# Patient Record
Sex: Female | Born: 1975 | Race: Black or African American | Hispanic: No | Marital: Single | Smoking: Never smoker
Health system: Southern US, Community
[De-identification: ages and names within clinical notes are randomized; demographics above are authoritative.]

## PROBLEM LIST (undated history)

## (undated) DIAGNOSIS — E0500 Thyrotoxicosis with diffuse goiter without thyrotoxic crisis or storm: Secondary | ICD-10-CM

## (undated) DIAGNOSIS — E890 Postprocedural hypothyroidism: Secondary | ICD-10-CM

## (undated) DIAGNOSIS — N921 Excessive and frequent menstruation with irregular cycle: Secondary | ICD-10-CM

## (undated) DIAGNOSIS — R5383 Other fatigue: Secondary | ICD-10-CM

## (undated) DIAGNOSIS — N914 Secondary oligomenorrhea: Secondary | ICD-10-CM

## (undated) DIAGNOSIS — E063 Autoimmune thyroiditis: Secondary | ICD-10-CM

## (undated) DIAGNOSIS — E039 Hypothyroidism, unspecified: Secondary | ICD-10-CM

## (undated) HISTORY — PX: ECTOPIC PREGNANCY SURGERY: SHX613

## (undated) HISTORY — DX: Postprocedural hypothyroidism: E89.0

## (undated) HISTORY — DX: Excessive and frequent menstruation with irregular cycle: N92.1

## (undated) HISTORY — DX: Other fatigue: R53.83

## (undated) HISTORY — DX: Thyrotoxicosis with diffuse goiter without thyrotoxic crisis or storm: E05.00

## (undated) HISTORY — DX: Hypothyroidism, unspecified: E03.9

## (undated) HISTORY — DX: Autoimmune thyroiditis: E06.3

## (undated) HISTORY — DX: Secondary oligomenorrhea: N91.4

---

## 1999-10-23 ENCOUNTER — Emergency Department (HOSPITAL_COMMUNITY): Admission: EM | Admit: 1999-10-23 | Discharge: 1999-10-23 | Payer: Self-pay

## 2000-04-23 ENCOUNTER — Emergency Department (HOSPITAL_COMMUNITY): Admission: EM | Admit: 2000-04-23 | Discharge: 2000-04-23 | Payer: Self-pay

## 2000-12-02 ENCOUNTER — Inpatient Hospital Stay (HOSPITAL_COMMUNITY): Admission: AD | Admit: 2000-12-02 | Discharge: 2000-12-02 | Payer: Self-pay | Admitting: Obstetrics

## 2000-12-02 ENCOUNTER — Encounter: Payer: Self-pay | Admitting: Obstetrics

## 2000-12-10 ENCOUNTER — Encounter: Payer: Self-pay | Admitting: Obstetrics

## 2000-12-10 ENCOUNTER — Inpatient Hospital Stay (HOSPITAL_COMMUNITY): Admission: RE | Admit: 2000-12-10 | Discharge: 2000-12-10 | Payer: Self-pay | Admitting: Obstetrics

## 2001-11-06 ENCOUNTER — Emergency Department (HOSPITAL_COMMUNITY): Admission: EM | Admit: 2001-11-06 | Discharge: 2001-11-06 | Payer: Self-pay | Admitting: Emergency Medicine

## 2005-08-28 ENCOUNTER — Ambulatory Visit: Payer: Self-pay | Admitting: Family Medicine

## 2005-09-04 ENCOUNTER — Ambulatory Visit (HOSPITAL_COMMUNITY): Admission: RE | Admit: 2005-09-04 | Discharge: 2005-09-04 | Payer: Self-pay | Admitting: Family Medicine

## 2005-09-19 ENCOUNTER — Ambulatory Visit: Payer: Self-pay | Admitting: *Deleted

## 2005-09-19 ENCOUNTER — Ambulatory Visit: Payer: Self-pay | Admitting: Family Medicine

## 2005-10-03 ENCOUNTER — Ambulatory Visit: Payer: Self-pay | Admitting: Family Medicine

## 2005-10-05 ENCOUNTER — Encounter (HOSPITAL_COMMUNITY): Admission: RE | Admit: 2005-10-05 | Discharge: 2006-01-03 | Payer: Self-pay | Admitting: Family Medicine

## 2005-11-07 ENCOUNTER — Ambulatory Visit: Payer: Self-pay | Admitting: "Endocrinology

## 2005-11-15 ENCOUNTER — Ambulatory Visit: Payer: Self-pay | Admitting: "Endocrinology

## 2006-01-08 ENCOUNTER — Ambulatory Visit: Payer: Self-pay | Admitting: "Endocrinology

## 2006-01-26 ENCOUNTER — Encounter (HOSPITAL_COMMUNITY): Admission: RE | Admit: 2006-01-26 | Discharge: 2006-04-26 | Payer: Self-pay | Admitting: "Endocrinology

## 2006-03-01 ENCOUNTER — Ambulatory Visit: Payer: Self-pay | Admitting: "Endocrinology

## 2006-06-19 ENCOUNTER — Ambulatory Visit: Payer: Self-pay | Admitting: "Endocrinology

## 2006-08-09 ENCOUNTER — Ambulatory Visit (HOSPITAL_COMMUNITY): Admission: RE | Admit: 2006-08-09 | Discharge: 2006-08-09 | Payer: Self-pay | Admitting: "Endocrinology

## 2006-09-11 ENCOUNTER — Ambulatory Visit (HOSPITAL_COMMUNITY): Admission: RE | Admit: 2006-09-11 | Discharge: 2006-09-11 | Payer: Self-pay | Admitting: "Endocrinology

## 2006-09-19 ENCOUNTER — Ambulatory Visit: Payer: Self-pay | Admitting: "Endocrinology

## 2006-10-12 ENCOUNTER — Encounter (HOSPITAL_COMMUNITY): Admission: RE | Admit: 2006-10-12 | Discharge: 2006-10-12 | Payer: Self-pay | Admitting: Internal Medicine

## 2006-12-24 ENCOUNTER — Ambulatory Visit: Payer: Self-pay | Admitting: "Endocrinology

## 2006-12-26 ENCOUNTER — Encounter: Admission: RE | Admit: 2006-12-26 | Discharge: 2006-12-26 | Payer: Self-pay | Admitting: "Endocrinology

## 2007-08-06 ENCOUNTER — Emergency Department (HOSPITAL_COMMUNITY): Admission: EM | Admit: 2007-08-06 | Discharge: 2007-08-06 | Payer: Self-pay | Admitting: *Deleted

## 2007-08-22 ENCOUNTER — Emergency Department (HOSPITAL_COMMUNITY): Admission: EM | Admit: 2007-08-22 | Discharge: 2007-08-22 | Payer: Self-pay | Admitting: Emergency Medicine

## 2007-12-13 ENCOUNTER — Ambulatory Visit: Payer: Self-pay | Admitting: "Endocrinology

## 2008-09-01 ENCOUNTER — Ambulatory Visit: Payer: Self-pay | Admitting: "Endocrinology

## 2009-03-03 ENCOUNTER — Ambulatory Visit: Payer: Self-pay | Admitting: "Endocrinology

## 2009-08-18 ENCOUNTER — Ambulatory Visit: Payer: Self-pay | Admitting: "Endocrinology

## 2010-01-04 ENCOUNTER — Emergency Department (HOSPITAL_COMMUNITY): Admission: EM | Admit: 2010-01-04 | Discharge: 2010-01-05 | Payer: Self-pay | Admitting: Emergency Medicine

## 2010-01-06 ENCOUNTER — Emergency Department (HOSPITAL_COMMUNITY): Admission: EM | Admit: 2010-01-06 | Discharge: 2010-01-06 | Payer: Self-pay | Admitting: Emergency Medicine

## 2010-10-05 ENCOUNTER — Ambulatory Visit: Payer: Self-pay | Admitting: "Endocrinology

## 2010-11-27 ENCOUNTER — Encounter: Payer: Self-pay | Admitting: "Endocrinology

## 2011-01-05 ENCOUNTER — Ambulatory Visit: Payer: Self-pay | Admitting: "Endocrinology

## 2011-01-30 LAB — CBC
HCT: 42.1 % (ref 36.0–46.0)
Hemoglobin: 13.8 g/dL (ref 12.0–15.0)
RBC: 4.39 MIL/uL (ref 3.87–5.11)
RDW: 13.5 % (ref 11.5–15.5)

## 2011-01-30 LAB — BASIC METABOLIC PANEL
CO2: 26 mEq/L (ref 19–32)
Calcium: 9.1 mg/dL (ref 8.4–10.5)
GFR calc non Af Amer: >60 mL/min (ref >60)
Potassium: 3.4 mEq/L — ABNORMAL LOW (ref 3.5–5.1)
Sodium: 133 mEq/L — ABNORMAL LOW (ref 135–145)

## 2011-01-30 LAB — DIFFERENTIAL
Basophils Relative: 0 % (ref 0–1)
Eosinophils Absolute: 0 10*3/uL (ref 0.0–0.7)
Eosinophils Relative: 0 % (ref 0–5)
Lymphs Abs: 1.8 10*3/uL (ref 0.7–4.0)
Monocytes Absolute: 0.9 10*3/uL (ref 0.1–1.0)
Neutro Abs: 26.8 10*3/uL — ABNORMAL HIGH (ref 1.7–7.7)
Neutrophils Relative %: 91 % — ABNORMAL HIGH (ref 43–77)

## 2011-01-30 LAB — RAPID STREP SCREEN (MED CTR MEBANE ONLY): Streptococcus, Group A Screen (Direct): NEGATIVE

## 2011-01-30 LAB — URINALYSIS, ROUTINE W REFLEX MICROSCOPIC
Glucose, UA: NEGATIVE mg/dL
Ketones, ur: >80 mg/dL — AB
Leukocytes, UA: NEGATIVE
Nitrite: NEGATIVE
Specific Gravity, Urine: 1.013 (ref 1.005–1.030)
pH: 6 (ref 5.0–8.0)

## 2011-01-30 LAB — URINE MICROSCOPIC-ADD ON

## 2011-02-07 ENCOUNTER — Ambulatory Visit: Payer: Self-pay | Admitting: "Endocrinology

## 2011-04-20 ENCOUNTER — Encounter: Payer: Self-pay | Admitting: *Deleted

## 2011-04-20 DIAGNOSIS — E0500 Thyrotoxicosis with diffuse goiter without thyrotoxic crisis or storm: Secondary | ICD-10-CM

## 2011-04-20 DIAGNOSIS — E890 Postprocedural hypothyroidism: Secondary | ICD-10-CM

## 2011-08-09 ENCOUNTER — Other Ambulatory Visit: Payer: Self-pay | Admitting: "Endocrinology

## 2011-08-14 ENCOUNTER — Other Ambulatory Visit: Payer: Self-pay | Admitting: "Endocrinology

## 2011-08-17 LAB — URINALYSIS, ROUTINE W REFLEX MICROSCOPIC
Ketones, ur: 15 — AB
Nitrite: POSITIVE — AB
Protein, ur: >300 — AB
Urobilinogen, UA: 1

## 2011-08-17 LAB — URINE CULTURE: Colony Count: >=100000

## 2011-08-17 LAB — POCT PREGNANCY, URINE: Operator id: 282201

## 2011-08-21 ENCOUNTER — Other Ambulatory Visit: Payer: Self-pay | Admitting: *Deleted

## 2011-08-23 ENCOUNTER — Telehealth: Payer: Self-pay | Admitting: *Deleted

## 2011-08-26 ENCOUNTER — Other Ambulatory Visit: Payer: Self-pay | Admitting: "Endocrinology

## 2011-09-06 NOTE — Telephone Encounter (Signed)
E-Scribed.

## 2011-12-06 ENCOUNTER — Other Ambulatory Visit: Payer: Self-pay | Admitting: *Deleted

## 2011-12-06 DIAGNOSIS — E0590 Thyrotoxicosis, unspecified without thyrotoxic crisis or storm: Secondary | ICD-10-CM

## 2011-12-09 LAB — T4, FREE: Free T4: 1.49 ng/dL (ref 0.80–1.80)

## 2011-12-09 LAB — T3, FREE: T3, Free: 2.7 pg/mL (ref 2.3–4.2)

## 2011-12-09 LAB — TSH: TSH: 2.456 u[IU]/mL (ref 0.350–4.500)

## 2011-12-18 ENCOUNTER — Ambulatory Visit: Payer: Self-pay | Admitting: "Endocrinology

## 2012-02-20 ENCOUNTER — Ambulatory Visit (INDEPENDENT_AMBULATORY_CARE_PROVIDER_SITE_OTHER): Payer: 59 | Admitting: "Endocrinology

## 2012-02-20 ENCOUNTER — Encounter: Payer: Self-pay | Admitting: "Endocrinology

## 2012-02-20 VITALS — BP 115/75 | HR 72 | Wt 116.3 lb

## 2012-02-20 DIAGNOSIS — R5383 Other fatigue: Secondary | ICD-10-CM

## 2012-02-20 DIAGNOSIS — N921 Excessive and frequent menstruation with irregular cycle: Secondary | ICD-10-CM

## 2012-02-20 DIAGNOSIS — E063 Autoimmune thyroiditis: Secondary | ICD-10-CM

## 2012-02-20 DIAGNOSIS — N920 Excessive and frequent menstruation with regular cycle: Secondary | ICD-10-CM

## 2012-02-20 DIAGNOSIS — E0500 Thyrotoxicosis with diffuse goiter without thyrotoxic crisis or storm: Secondary | ICD-10-CM

## 2012-02-20 DIAGNOSIS — E890 Postprocedural hypothyroidism: Secondary | ICD-10-CM

## 2012-02-20 DIAGNOSIS — N914 Secondary oligomenorrhea: Secondary | ICD-10-CM

## 2012-02-20 DIAGNOSIS — N915 Oligomenorrhea, unspecified: Secondary | ICD-10-CM

## 2012-02-20 NOTE — Patient Instructions (Signed)
Followup visit in 6 months. Please have lab tests repeated about 2 weeks prior to next visit.
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
## 2012-05-20 ENCOUNTER — Other Ambulatory Visit: Payer: Self-pay | Admitting: "Endocrinology

## 2012-06-24 ENCOUNTER — Other Ambulatory Visit: Payer: Self-pay | Admitting: "Endocrinology

## 2012-07-30 ENCOUNTER — Other Ambulatory Visit: Payer: Self-pay | Admitting: *Deleted

## 2012-07-30 DIAGNOSIS — E038 Other specified hypothyroidism: Secondary | ICD-10-CM

## 2012-08-01 ENCOUNTER — Ambulatory Visit: Payer: 59 | Admitting: "Endocrinology

## 2012-08-06 LAB — TSH: TSH: 3.072 u[IU]/mL (ref 0.350–4.500)

## 2012-08-06 LAB — T3, FREE: T3, Free: 3 pg/mL (ref 2.3–4.2)

## 2012-08-07 ENCOUNTER — Ambulatory Visit (INDEPENDENT_AMBULATORY_CARE_PROVIDER_SITE_OTHER): Payer: 59 | Admitting: "Endocrinology

## 2012-08-07 ENCOUNTER — Encounter: Payer: Self-pay | Admitting: "Endocrinology

## 2012-08-07 VITALS — BP 126/70 | HR 87 | Wt 110.4 lb

## 2012-08-07 DIAGNOSIS — E0500 Thyrotoxicosis with diffuse goiter without thyrotoxic crisis or storm: Secondary | ICD-10-CM

## 2012-08-07 DIAGNOSIS — F1510 Other stimulant abuse, uncomplicated: Secondary | ICD-10-CM

## 2012-08-07 DIAGNOSIS — IMO0002 Reserved for concepts with insufficient information to code with codable children: Secondary | ICD-10-CM

## 2012-08-07 DIAGNOSIS — E890 Postprocedural hypothyroidism: Secondary | ICD-10-CM

## 2012-08-07 DIAGNOSIS — E063 Autoimmune thyroiditis: Secondary | ICD-10-CM

## 2012-08-07 DIAGNOSIS — E049 Nontoxic goiter, unspecified: Secondary | ICD-10-CM

## 2012-08-07 NOTE — Patient Instructions (Signed)
Follow up visit in 6 months. Repeat TFTs in 3 and 6 months. Reduce caffeine intake by 25% per week for next 3-4 weeks.
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
## 2012-08-22 ENCOUNTER — Ambulatory Visit: Payer: 59 | Admitting: "Endocrinology

## 2012-10-19 ENCOUNTER — Other Ambulatory Visit: Payer: Self-pay | Admitting: "Endocrinology

## 2013-01-27 ENCOUNTER — Other Ambulatory Visit: Payer: Self-pay | Admitting: *Deleted

## 2013-01-27 DIAGNOSIS — E890 Postprocedural hypothyroidism: Secondary | ICD-10-CM

## 2013-02-17 LAB — TSH: TSH: 3.069 u[IU]/mL (ref 0.350–4.500)

## 2013-02-19 ENCOUNTER — Ambulatory Visit (INDEPENDENT_AMBULATORY_CARE_PROVIDER_SITE_OTHER): Payer: 59 | Admitting: "Endocrinology

## 2013-02-19 ENCOUNTER — Encounter: Payer: Self-pay | Admitting: "Endocrinology

## 2013-02-19 VITALS — BP 116/72 | HR 86 | Wt 114.2 lb

## 2013-02-19 DIAGNOSIS — R5383 Other fatigue: Secondary | ICD-10-CM

## 2013-02-19 DIAGNOSIS — E063 Autoimmune thyroiditis: Secondary | ICD-10-CM

## 2013-02-19 DIAGNOSIS — E039 Hypothyroidism, unspecified: Secondary | ICD-10-CM

## 2013-02-19 DIAGNOSIS — R5381 Other malaise: Secondary | ICD-10-CM

## 2013-02-19 DIAGNOSIS — E0500 Thyrotoxicosis with diffuse goiter without thyrotoxic crisis or storm: Secondary | ICD-10-CM

## 2013-02-19 DIAGNOSIS — E890 Postprocedural hypothyroidism: Secondary | ICD-10-CM

## 2013-02-19 MED ORDER — SYNTHROID 88 MCG PO TABS: ORAL_TABLET | ORAL | Status: DC
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
## 2013-02-19 NOTE — Patient Instructions (Signed)
Follow up visit in 6 months. Please repeat thyroid blood tests in 2 and 6 months.

## 2013-04-25 LAB — TSH: TSH: 0.225 u[IU]/mL — ABNORMAL LOW (ref 0.350–4.500)

## 2013-04-25 LAB — T3, FREE: T3, Free: 2.8 pg/mL (ref 2.3–4.2)

## 2013-05-08 ENCOUNTER — Telehealth: Payer: Self-pay | Admitting: "Endocrinology

## 2013-05-08 NOTE — Telephone Encounter (Signed)
1. I called patient with her lab results from 04/25/13: TSH was low at 0.225. Free T4 was higher at 1.30. Free T3 was higher at 2.8.  2. She is currently taking Synthroid, 88 mcg tablets. She takes one tablet 5 days per week and 1.5 tablets = 132 mcg on Wednesdays and Sundays. She feels "sort of hyper". 3. Assessment: patient is mildly hyperthyroid, both clinically and chemically. 4. Plan. Take only one 88 mcg tablet per day from now until July 14th. On July 14th take 1.5 tablets. Thereafter take 88 mcg/day 6 days per week, 1.5 tablets = 132 mcg each Sunday. Repeat TFTs in 2 months.  David Stall

## 2013-07-17 ENCOUNTER — Other Ambulatory Visit: Payer: Self-pay | Admitting: *Deleted

## 2013-07-17 DIAGNOSIS — E039 Hypothyroidism, unspecified: Secondary | ICD-10-CM

## 2013-07-23 ENCOUNTER — Encounter (HOSPITAL_COMMUNITY): Payer: Self-pay | Admitting: Emergency Medicine

## 2013-07-23 ENCOUNTER — Emergency Department (HOSPITAL_COMMUNITY)
Admission: EM | Admit: 2013-07-23 | Discharge: 2013-07-24 | Disposition: A | Discharge status: Home / Self Care | Payer: 59 | Attending: Emergency Medicine | Admitting: Emergency Medicine

## 2013-07-23 DIAGNOSIS — R05 Cough: Secondary | ICD-10-CM | POA: Insufficient documentation

## 2013-07-23 DIAGNOSIS — IMO0001 Reserved for inherently not codable concepts without codable children: Secondary | ICD-10-CM | POA: Insufficient documentation

## 2013-07-23 DIAGNOSIS — R059 Cough, unspecified: Secondary | ICD-10-CM | POA: Insufficient documentation

## 2013-07-23 DIAGNOSIS — J3489 Other specified disorders of nose and nasal sinuses: Secondary | ICD-10-CM | POA: Insufficient documentation

## 2013-07-23 DIAGNOSIS — R630 Anorexia: Secondary | ICD-10-CM | POA: Insufficient documentation

## 2013-07-23 DIAGNOSIS — R000 Tachycardia, unspecified: Secondary | ICD-10-CM | POA: Insufficient documentation

## 2013-07-23 DIAGNOSIS — J069 Acute upper respiratory infection, unspecified: Secondary | ICD-10-CM | POA: Insufficient documentation

## 2013-07-23 DIAGNOSIS — Z79899 Other long term (current) drug therapy: Secondary | ICD-10-CM | POA: Insufficient documentation

## 2013-07-23 DIAGNOSIS — E039 Hypothyroidism, unspecified: Secondary | ICD-10-CM | POA: Insufficient documentation

## 2013-07-23 NOTE — ED Notes (Signed)
Pt c/o recurrent fever throughout the day with cough, sneezing, chest discomfort with deep breath. Body aches, chills.

## 2013-07-24 MED ORDER — IBUPROFEN 200 MG PO TABS
400.0000 mg | ORAL_TABLET | Freq: Once | ORAL | Status: AC
  Administered 2013-07-24: 400 mg via ORAL
  Filled 2013-07-24: qty 2

## 2013-07-24 MED ORDER — HYDROCODONE-ACETAMINOPHEN 7.5-325 MG/15ML PO SOLN: 15.0000 mL | Freq: Four times a day (QID) | ORAL | Status: AC | PRN

## 2013-07-24 NOTE — ED Provider Notes (Signed)
Medical screening examination/treatment/procedure(s) were performed by non-physician practitioner and as supervising physician I was immediately available for consultation/collaboration.  Rutger Salton, MD 07/24/13 0415 

## 2013-07-24 NOTE — ED Notes (Signed)
Patient updated on treatment plan. Patient to take ibuprofen as ordered, drink po fluids and re-evaluate vital signs at @0050 

## 2013-07-24 NOTE — ED Provider Notes (Signed)
CSN: [id number]     Arrival date & time 07/23/13  2315 History   First MD Initiated Contact with Patient 07/23/13 2352     Chief Complaint  Patient presents with  . Fever   (Consider location/radiation/quality/duration/timing/severity/associated sxs/prior Treatment) HPI  37 year old female with hx of thyroid disease presents with URI sxs.  Pt sts yesterday she noticed that her throat was itchy.  Today while at work she endorse nasal congestion, sneeze, non productive cough, fever of 100.6, chills, body aches and decreased appetite.  No significant headache, neck stiffness, doe, hemoptysis, n/v/d, abd pain, back pain, dysuria or rash.  Has tried taking tylenol with some improvement.  Report exposed to niece last week that was sick. No recent out of state travel or travel to endemic area.  No significant risk factors for PE.    Past Medical History  Diagnosis Date  . Thyrotoxicosis with diffuse goiter   . Hypothyroid   . Hypothyroidism following radioiodine therapy   . Toxic diffuse goiter with exophthalmos   . Thyroiditis, autoimmune   . Fatigue   . Secondary oligomenorrhea   . Menometrorrhagia    Past Surgical History  Procedure Laterality Date  . Ectopic pregnancy surgery     Family History  Problem Relation Age of Onset  . Heart disease Father   . Thyroid disease Maternal Aunt     hypothyroid  . Heart disease Maternal Aunt   . Heart disease Paternal Aunt   . Thyroid disease Paternal Grandfather     hyperthyroid  . Heart disease Paternal Grandfather   . Diabetes Neg Hx    History  Substance Use Topics  . Smoking status: Never Smoker   . Smokeless tobacco: Never Used  . Alcohol Use: No   OB History   Grav Para Term Preterm Abortions TAB SAB Ect Mult Living                 Review of Systems  All other systems reviewed and are negative.    Allergies  Tapazole  Home Medications   Current Outpatient Rx  Name  Route  Sig  Dispense  Refill  . diphenhydrAMINE  (SOMINEX) 25 MG tablet   Oral   Take 25 mg by mouth at bedtime as needed for allergies.          Marland Kitchen levothyroxine (SYNTHROID, LEVOTHROID) 88 MCG tablet   Oral   Take 88-132 mcg by mouth daily before breakfast. Take 1 tablet all days of the week except Sunday take 1.5 tablets          BP 122/81  Pulse 118  Temp(Src) 99.3 F (37.4 C) (Oral)  Resp 20  Ht 5\' 1"  (1.549 m)  Wt 115 lb (52.164 kg)  BMI 21.74 kg/m2  SpO2 100%  LMP 06/29/2013 Physical Exam  Nursing note and vitals reviewed. Constitutional: She is oriented to person, place, and time. She appears well-developed and well-nourished. No distress.  Awake, alert, nontoxic appearance  HENT:  Head: Atraumatic.  Right Ear: External ear normal.  Left Ear: External ear normal.  Nose: Nose normal.  Mouth/Throat: Uvula is midline, oropharynx is clear and moist and mucous membranes are normal.  Eyes: Conjunctivae are normal. Right eye exhibits no discharge. Left eye exhibits no discharge.  Neck: Neck supple. No tracheal deviation present. No thyromegaly present.  No nuchal rigidity   Cardiovascular: Regular rhythm.   Tachycardic without M/R/G  Pulmonary/Chest: Effort normal. No respiratory distress. She exhibits no tenderness.  Abdominal: Soft.  There is no tenderness. There is no rebound.  Musculoskeletal: She exhibits no edema and no tenderness.  ROM appears intact, no obvious focal weakness  Lymphadenopathy:    She has no cervical adenopathy.  Neurological: She is alert and oriented to person, place, and time.  Mental status and motor strength appears intact  Skin: No rash noted.  Psychiatric: She has a normal mood and affect.    ED Course  Procedures (including critical care time)  Pt here with URI sxs.  No red flags.  No significant risk factor for PE although pt is tachycardic.  Has hx of thyroid disease currently taking synthroid.  Doubt thyrotoxicosis.    2:17 AM Vs improves after oral fluid and ibuprofen.  Pt  stable for d/c.  Return precaution discussed.    Labs Review Labs Reviewed - No data to display Imaging Review No results found.  MDM   1. URI (upper respiratory infection)    BP 107/57  Pulse 106  Temp(Src) 99.6 F (37.6 C) (Oral)  Resp 18  Ht 5\' 1"  (1.549 m)  Wt 115 lb (52.164 kg)  BMI 21.74 kg/m2  SpO2 100%  LMP 06/29/2013     Fayrene Helper, PA-C 07/24/13 0217

## 2013-08-06 LAB — T3, FREE: T3, Free: 2.5 pg/mL (ref 2.3–4.2)

## 2013-08-07 ENCOUNTER — Encounter: Payer: Self-pay | Admitting: "Endocrinology

## 2013-08-07 ENCOUNTER — Ambulatory Visit (INDEPENDENT_AMBULATORY_CARE_PROVIDER_SITE_OTHER): Payer: 59 | Admitting: "Endocrinology

## 2013-08-07 VITALS — BP 117/74 | HR 87 | Wt 116.3 lb

## 2013-08-07 DIAGNOSIS — R5381 Other malaise: Secondary | ICD-10-CM

## 2013-08-07 DIAGNOSIS — E063 Autoimmune thyroiditis: Secondary | ICD-10-CM

## 2013-08-07 DIAGNOSIS — E038 Other specified hypothyroidism: Secondary | ICD-10-CM

## 2013-08-07 DIAGNOSIS — H0520 Unspecified exophthalmos: Secondary | ICD-10-CM

## 2013-08-07 DIAGNOSIS — E0500 Thyrotoxicosis with diffuse goiter without thyrotoxic crisis or storm: Secondary | ICD-10-CM
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
## 2013-08-07 NOTE — Patient Instructions (Signed)
Follow up visit in 6 months. 

## 2013-08-21 ENCOUNTER — Ambulatory Visit: Payer: 59 | Admitting: "Endocrinology

## 2013-09-28 ENCOUNTER — Other Ambulatory Visit: Payer: Self-pay | Admitting: "Endocrinology

## 2013-09-28 DIAGNOSIS — E038 Other specified hypothyroidism: Secondary | ICD-10-CM

## 2013-10-06 ENCOUNTER — Ambulatory Visit: Payer: 59 | Attending: Internal Medicine | Admitting: Internal Medicine

## 2013-10-06 ENCOUNTER — Encounter: Payer: Self-pay | Admitting: Internal Medicine

## 2013-10-06 VITALS — BP 119/83 | HR 95 | Temp 98.5°F | Resp 16 | Ht 61.0 in | Wt 122.6 lb

## 2013-10-06 DIAGNOSIS — Z139 Encounter for screening, unspecified: Secondary | ICD-10-CM

## 2013-10-06 DIAGNOSIS — E890 Postprocedural hypothyroidism: Secondary | ICD-10-CM

## 2013-10-06 DIAGNOSIS — E039 Hypothyroidism, unspecified: Secondary | ICD-10-CM | POA: Insufficient documentation

## 2013-10-06 LAB — COMPLETE METABOLIC PANEL WITH GFR
AST: 13 U/L (ref 0–37)
Albumin: 4 g/dL (ref 3.5–5.2)
Alkaline Phosphatase: 70 U/L (ref 39–117)
BUN: 5 mg/dL — ABNORMAL LOW (ref 6–23)
Creat: 0.71 mg/dL (ref 0.50–1.10)
Potassium: 4 mEq/L (ref 3.5–5.3)
Sodium: 137 mEq/L (ref 135–145)
Total Protein: 6.7 g/dL (ref 6.0–8.3)

## 2013-10-06 LAB — CBC WITH DIFFERENTIAL/PLATELET
Basophils Absolute: 0 10*3/uL (ref 0.0–0.1)
Basophils Relative: 0 % (ref 0–1)
Eosinophils Absolute: 0.3 10*3/uL (ref 0.0–0.7)
Eosinophils Relative: 4 % (ref 0–5)
Hemoglobin: 14.3 g/dL (ref 12.0–15.0)
MCH: 32.1 pg (ref 26.0–34.0)
MCHC: 34.9 g/dL (ref 30.0–36.0)
MCV: 92.1 fL (ref 78.0–100.0)
Monocytes Absolute: 0.4 10*3/uL (ref 0.1–1.0)
Monocytes Relative: 5 % (ref 3–12)
Neutro Abs: 4.5 10*3/uL (ref 1.7–7.7)
Neutrophils Relative %: 57 % (ref 43–77)
Platelets: 313 10*3/uL (ref 150–400)
RDW: 13.6 % (ref 11.5–15.5)

## 2013-10-06 LAB — LIPID PANEL
HDL: 51 mg/dL (ref >39)
LDL Cholesterol: 77 mg/dL (ref 0–99)
Total CHOL/HDL Ratio: 2.8 Ratio
Triglycerides: 67 mg/dL (ref <150)
VLDL: 13 mg/dL (ref 0–40)

## 2013-10-06 NOTE — Progress Notes (Signed)
Patient here to establish care Takes synthroid for  Her thyroid History of graves disease

## 2013-10-06 NOTE — Progress Notes (Signed)
Patient ID: Jessica Maxwell, female   DOB: 1975/11/15, 37 y.o.   MRN: [id number] MRN: [id number] Name: Jessica Maxwell  Sex: female Age: 37 y.o. DOB: [birth year]  Allergies: Tapazole  Chief Complaint  Patient presents with  . Establish Care    HPI: Patient is 37 y.o. female who comes for the first time to establish medical care, she has history of graves disease status post radiation therapy, currently hypothyroid and taking levothyroxine 88 mcg following up with endocrinologist, she denies any acute symptoms, she brought the form to be filled out for her job, needs to be checked for hemoglobin A1c lipid profile and nicotine metabolites. Patient denies smoking cigarettes.  Past Medical History  Diagnosis Date  . Thyrotoxicosis with diffuse goiter   . Hypothyroid   . Hypothyroidism following radioiodine therapy   . Toxic diffuse goiter with exophthalmos   . Thyroiditis, autoimmune   . Fatigue   . Secondary oligomenorrhea   . Menometrorrhagia     Past Surgical History  Procedure Laterality Date  . Ectopic pregnancy surgery        Medication List       This list is accurate as of: 10/06/13 11:21 AM.  Always use your most recent med list.               diphenhydrAMINE 25 MG tablet  Commonly known as:  SOMINEX  Take 25 mg by mouth at bedtime as needed for allergies.     HYDROcodone-acetaminophen 7.5-325 mg/15 ml solution  Commonly known as:  HYCET  Take 15 mLs by mouth 4 (four) times daily as needed for pain.     levothyroxine 88 MCG tablet  Commonly known as:  SYNTHROID, LEVOTHROID  Take 88-132 mcg by mouth daily before breakfast. Take 1 tablet all days of the week except Sunday take 1.5 tablets        No orders of the defined types were placed in this encounter.     There is no immunization history on file for this patient.  History  Substance Use Topics  . Smoking status: Never Smoker   . Smokeless tobacco: Never Used  . Alcohol Use: No    Review of  Systems  As noted in HPI  Filed Vitals:   10/06/13 1053  BP: 119/83  Pulse: 95  Temp: 98.5 F (36.9 C)  Resp: 16    Physical Exam  Physical Exam  Constitutional: No distress.  Eyes: EOM are normal. Pupils are equal, round, and reactive to light.  Neck:  Mild thyromegaly   Cardiovascular: Normal rate and regular rhythm.   Pulmonary/Chest: Breath sounds normal. No respiratory distress. She has no wheezes. She has no rales.  Musculoskeletal: She exhibits no edema.    CBC    Component Value Date/Time   WBC 29.5* 01/04/2010 2215   RBC 4.39 01/04/2010 2215   HGB 13.8 01/04/2010 2215   HCT 42.1 01/04/2010 2215   PLT 293 01/04/2010 2215   MCV 96.0 01/04/2010 2215   LYMPHSABS 1.8 01/04/2010 2215   MONOABS 0.9 01/04/2010 2215   EOSABS 0.0 01/04/2010 2215   BASOSABS 0.0 01/04/2010 2215    CMP     Component Value Date/Time   NA 133* 01/04/2010 2215   K 3.4* 01/04/2010 2215   CL 100 01/04/2010 2215   CO2 26 01/04/2010 2215   GLUCOSE 86 01/04/2010 2215   BUN 5* 01/04/2010 2215   CREATININE 0.68 01/04/2010 2215   CALCIUM 9.1 01/04/2010 2215   GFRNONAA >60  01/04/2010 2215   GFRAA  Value: >60        The eGFR has been calculated using the MDRD equation. This calculation has not been validated in all clinical situations. eGFR's persistently <60 mL/min signify possible Chronic Kidney Disease. 01/04/2010 2215    No results found for this basename: chol, tri, ldl    No components found with this basename: hga1c    No results found for this basename: AST    Assessment and Plan  Hypothyroidism following radioiodine therapy.. continue with current dose of levothyroxine as per her endocrinologist.  Screening - Plan: Hemoglobin A1c, Nicotine/cotinine metabolites, Lipid panel, Vit D  25 hydroxy (rtn osteoporosis monitoring), CBC with Differential, COMPLETE METABOLIC PANEL WITH GFR, Ambulatory referral to Gynecology   Health Maintenance - Patient declined flu shot   -Pap Smear: Referred to GYN   Return in  about 3 weeks (around 10/27/2013).  Doris Cheadle, MD

## 2013-10-07 LAB — VITAMIN D 25 HYDROXY (VIT D DEFICIENCY, FRACTURES): Vit D, 25-Hydroxy: 10 ng/mL — ABNORMAL LOW (ref 30–89)

## 2013-10-08 LAB — NICOTINE/COTININE METABOLITES: Cotinine: <10 ng/mL

## 2013-10-27 ENCOUNTER — Ambulatory Visit: Payer: 59 | Admitting: Internal Medicine

## 2013-10-27 ENCOUNTER — Encounter: Payer: Self-pay | Admitting: *Deleted

## 2014-01-28 ENCOUNTER — Encounter: Payer: 59 | Admitting: Family Medicine

## 2014-02-05 ENCOUNTER — Encounter: Payer: Self-pay | Admitting: "Endocrinology

## 2014-02-05 ENCOUNTER — Ambulatory Visit: Payer: 59 | Admitting: "Endocrinology

## 2014-02-05 ENCOUNTER — Ambulatory Visit (INDEPENDENT_AMBULATORY_CARE_PROVIDER_SITE_OTHER): Payer: 59 | Admitting: "Endocrinology

## 2014-02-05 VITALS — BP 107/78 | HR 85 | Wt 123.0 lb

## 2014-02-05 DIAGNOSIS — E063 Autoimmune thyroiditis: Secondary | ICD-10-CM

## 2014-02-05 DIAGNOSIS — R5383 Other fatigue: Secondary | ICD-10-CM

## 2014-02-05 DIAGNOSIS — E890 Postprocedural hypothyroidism: Secondary | ICD-10-CM

## 2014-02-05 DIAGNOSIS — R5381 Other malaise: Secondary | ICD-10-CM

## 2014-02-05 DIAGNOSIS — H0520 Unspecified exophthalmos: Secondary | ICD-10-CM

## 2014-02-05 DIAGNOSIS — E049 Nontoxic goiter, unspecified: Secondary | ICD-10-CM
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
## 2014-02-05 NOTE — Patient Instructions (Signed)
Follow up visit in 6 months. Please have lab tests done in 3 months and 6 months.

## 2014-02-20 ENCOUNTER — Encounter: Payer: Self-pay | Admitting: Medical

## 2014-02-20 ENCOUNTER — Ambulatory Visit (INDEPENDENT_AMBULATORY_CARE_PROVIDER_SITE_OTHER): Payer: 59 | Admitting: Medical

## 2014-02-20 VITALS — BP 113/81 | HR 79 | Ht 61.0 in | Wt 128.2 lb

## 2014-02-20 DIAGNOSIS — Z01419 Encounter for gynecological examination (general) (routine) without abnormal findings: Secondary | ICD-10-CM

## 2014-02-20 NOTE — Progress Notes (Signed)
Patient ID: Jessica Maxwell, female   DOB: [birth year], 38 y.o.   MRN: [id number] Subjective:    Jessica Maxwell is a 38 y.o. female who presents for an annual exam. The patient has no complaints today. The patient is sexually active. GYN screening history: last pap: was normal. Last Pap smear ~ 10 years ago. The patient wears seatbelts: yes. The patient participates in regular exercise: yes. Has the patient ever been transfused or tattooed?: yes. Tattoo on right calf. The patient reports that there is not domestic violence in her life. The patient denies a history of abnormal pap smears. She states she has regular periods. She denies abdominal pain, vaginal discharge, irregular bleeding or breast concerns. She is not on anything for birth control, but declines birth control counseling today.   Menstrual History: OB History   Grav Para Term Preterm Abortions TAB SAB Ect Mult Living   3    3 2  1   0      Menarche age: 38 years old  Patient's last menstrual period was 01/29/2014.    The following portions of the patient's history were reviewed and updated as appropriate: allergies, current medications, past family history, past medical history, past social history, past surgical history and problem list.  Review of Systems Pertinent items are noted in HPI.    Objective:     BP 113/81  Pulse 79  Ht 5\' 1"  (1.549 m)  Wt 128 lb 3.2 oz (58.151 kg)  BMI 24.24 kg/m2  LMP 01/29/2014 GENERAL: Well-developed, well-nourished female in no acute distress.  HEENT: Normocephalic, atraumatic. Sclerae anicteric.  LUNGS: Clear to auscultation bilaterally.  HEART: Regular rate and rhythm. BREASTS: Symmetric in size. No masses, skin changes, nipple drainage, or lymphadenopathy. ABDOMEN: Soft, nontender, nondistended. No organomegaly. PELVIC: Normal external female genitalia. Vagina is pink and rugated.  Normal discharge. Normal cervix contour. Pap smear obtained. Uterus is normal in size. No adnexal mass  or tenderness.  EXTREMITIES: No cyanosis, clubbing, or edema  .    Assessment:    Healthy female exam.    Plan:     1. Pap smear obtained. Patient will be contacted with any abnormal results  2. Patient to return to [facility] in 1 year for annual exam or sooner PRN

## 2014-05-20 LAB — T4, FREE: Free T4: 1.34 ng/dL (ref 0.80–1.80)

## 2014-05-20 LAB — T3, FREE: T3, Free: 3.1 pg/mL (ref 2.3–4.2)

## 2014-05-20 LAB — TSH: TSH: 0.736 u[IU]/mL (ref 0.350–4.500)

## 2014-05-22 ENCOUNTER — Telehealth: Payer: Self-pay | Admitting: *Deleted

## 2014-05-22 NOTE — Telephone Encounter (Signed)
Spoke to patient, advised that  TFTs in July were normal, in the upper 20% of the normal range.

## 2014-05-30 ENCOUNTER — Other Ambulatory Visit: Payer: Self-pay | Admitting: "Endocrinology

## 2014-06-09 ENCOUNTER — Other Ambulatory Visit: Payer: Self-pay | Admitting: "Endocrinology

## 2014-08-10 ENCOUNTER — Ambulatory Visit: Payer: 59 | Admitting: "Endocrinology

## 2014-08-19 ENCOUNTER — Ambulatory Visit (INDEPENDENT_AMBULATORY_CARE_PROVIDER_SITE_OTHER): Payer: 59 | Admitting: "Endocrinology

## 2014-08-19 ENCOUNTER — Encounter: Payer: Self-pay | Admitting: "Endocrinology

## 2014-08-19 VITALS — BP 128/77 | HR 88 | Wt 127.5 lb

## 2014-08-19 DIAGNOSIS — E049 Nontoxic goiter, unspecified: Secondary | ICD-10-CM

## 2014-08-19 DIAGNOSIS — E063 Autoimmune thyroiditis: Secondary | ICD-10-CM

## 2014-08-19 DIAGNOSIS — R5382 Chronic fatigue, unspecified: Secondary | ICD-10-CM

## 2014-08-19 DIAGNOSIS — H0520 Unspecified exophthalmos: Secondary | ICD-10-CM

## 2014-08-19 DIAGNOSIS — E349 Endocrine disorder, unspecified: Secondary | ICD-10-CM

## 2014-08-19 DIAGNOSIS — E890 Postprocedural hypothyroidism: Secondary | ICD-10-CM

## 2014-08-19 DIAGNOSIS — R5381 Other malaise: Secondary | ICD-10-CM
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
## 2014-08-19 NOTE — Patient Instructions (Signed)
Follow up visit in 6 months. Please have lab tests drawn at 3 months and one week before next visit.

## 2014-08-20 DIAGNOSIS — H0520 Unspecified exophthalmos: Secondary | ICD-10-CM | POA: Insufficient documentation

## 2014-08-20 DIAGNOSIS — E349 Endocrine disorder, unspecified: Secondary | ICD-10-CM | POA: Insufficient documentation

## 2014-08-20 LAB — T3, FREE: T3, Free: 2.6 pg/mL (ref 2.3–4.2)

## 2014-08-20 LAB — TSH: TSH: 1.027 u[IU]/mL (ref 0.350–4.500)

## 2014-08-20 LAB — T4, FREE: Free T4: 1.07 ng/dL (ref 0.80–1.80)

## 2014-08-21 ENCOUNTER — Other Ambulatory Visit: Payer: Self-pay

## 2014-08-25 LAB — THYROID STIMULATING IMMUNOGLOBULIN: TSI: 50 %{baseline} (ref <140)

## 2014-09-07 ENCOUNTER — Encounter: Payer: Self-pay | Admitting: "Endocrinology

## 2014-10-12 ENCOUNTER — Encounter: Payer: Self-pay | Admitting: *Deleted

## 2015-01-13 ENCOUNTER — Other Ambulatory Visit: Payer: Self-pay | Admitting: *Deleted

## 2015-01-13 DIAGNOSIS — E034 Atrophy of thyroid (acquired): Secondary | ICD-10-CM

## 2015-02-03 ENCOUNTER — Other Ambulatory Visit: Payer: Self-pay | Admitting: "Endocrinology

## 2015-02-04 ENCOUNTER — Telehealth: Payer: Self-pay | Admitting: *Deleted

## 2015-02-04 NOTE — Telephone Encounter (Signed)
LVM to call and reschedule 4/14 appt.

## 2015-02-08 ENCOUNTER — Other Ambulatory Visit: Payer: Self-pay | Admitting: *Deleted

## 2015-02-08 ENCOUNTER — Telehealth: Payer: Self-pay | Admitting: "Endocrinology

## 2015-02-08 DIAGNOSIS — E890 Postprocedural hypothyroidism: Secondary | ICD-10-CM

## 2015-02-08 MED ORDER — LEVOTHYROXINE SODIUM 88 MCG PO TABS: 88.0000 ug | ORAL_TABLET | Freq: Every day | ORAL | Status: DC

## 2015-02-08 NOTE — Telephone Encounter (Signed)
Sent via escribe

## 2015-02-12 ENCOUNTER — Other Ambulatory Visit: Payer: Self-pay | Admitting: "Endocrinology

## 2015-02-18 ENCOUNTER — Ambulatory Visit: Payer: 59 | Admitting: "Endocrinology

## 2015-03-31 ENCOUNTER — Ambulatory Visit (INDEPENDENT_AMBULATORY_CARE_PROVIDER_SITE_OTHER): Payer: BLUE CROSS/BLUE SHIELD | Admitting: "Endocrinology

## 2015-03-31 ENCOUNTER — Encounter: Payer: Self-pay | Admitting: "Endocrinology

## 2015-03-31 VITALS — BP 113/81 | HR 76 | Wt 130.2 lb

## 2015-03-31 DIAGNOSIS — R5383 Other fatigue: Secondary | ICD-10-CM

## 2015-03-31 DIAGNOSIS — E0500 Thyrotoxicosis with diffuse goiter without thyrotoxic crisis or storm: Secondary | ICD-10-CM | POA: Diagnosis not present

## 2015-03-31 DIAGNOSIS — E063 Autoimmune thyroiditis: Secondary | ICD-10-CM | POA: Diagnosis not present

## 2015-03-31 DIAGNOSIS — E890 Postprocedural hypothyroidism: Secondary | ICD-10-CM | POA: Diagnosis not present

## 2015-03-31 DIAGNOSIS — H0520 Unspecified exophthalmos: Secondary | ICD-10-CM

## 2015-03-31 DIAGNOSIS — E349 Endocrine disorder, unspecified: Secondary | ICD-10-CM

## 2015-03-31 LAB — T3, FREE: T3, Free: 3.1 pg/mL (ref 2.3–4.2)

## 2015-03-31 LAB — T4, FREE: Free T4: 1.29 ng/dL (ref 0.80–1.80)

## 2015-03-31 LAB — TSH: TSH: 0.382 u[IU]/mL (ref 0.350–4.500)
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
## 2015-03-31 NOTE — Patient Instructions (Signed)
Follow up visit in 6 months. Please repeat thyroid blood tests in August. Please repeat thyroid blood tests and TSI in November.

## 2015-10-04 ENCOUNTER — Ambulatory Visit: Payer: Self-pay | Admitting: "Endocrinology

## 2015-10-04 ENCOUNTER — Other Ambulatory Visit: Payer: Self-pay | Admitting: "Endocrinology

## 2015-10-04 LAB — T4, FREE: Free T4: 1.15 ng/dL (ref 0.80–1.80)

## 2015-10-04 LAB — TSH: TSH: 4.29 u[IU]/mL (ref 0.350–4.500)

## 2015-10-04 LAB — T3, FREE: T3, Free: 2.4 pg/mL (ref 2.3–4.2)

## 2015-10-07 ENCOUNTER — Ambulatory Visit (INDEPENDENT_AMBULATORY_CARE_PROVIDER_SITE_OTHER): Payer: BLUE CROSS/BLUE SHIELD | Admitting: "Endocrinology

## 2015-10-07 ENCOUNTER — Encounter: Payer: Self-pay | Admitting: "Endocrinology

## 2015-10-07 VITALS — BP 111/78 | HR 80 | Wt 137.0 lb

## 2015-10-07 DIAGNOSIS — E0500 Thyrotoxicosis with diffuse goiter without thyrotoxic crisis or storm: Secondary | ICD-10-CM

## 2015-10-07 DIAGNOSIS — E049 Nontoxic goiter, unspecified: Secondary | ICD-10-CM | POA: Diagnosis not present

## 2015-10-07 DIAGNOSIS — E663 Overweight: Secondary | ICD-10-CM | POA: Diagnosis not present

## 2015-10-07 DIAGNOSIS — E349 Endocrine disorder, unspecified: Secondary | ICD-10-CM

## 2015-10-07 DIAGNOSIS — E063 Autoimmune thyroiditis: Secondary | ICD-10-CM

## 2015-10-07 DIAGNOSIS — R5383 Other fatigue: Secondary | ICD-10-CM

## 2015-10-07 DIAGNOSIS — E890 Postprocedural hypothyroidism: Secondary | ICD-10-CM | POA: Diagnosis not present

## 2015-10-07 DIAGNOSIS — H0520 Unspecified exophthalmos: Secondary | ICD-10-CM

## 2015-10-07 LAB — THYROID STIMULATING IMMUNOGLOBULIN: TSI: 62 % baseline (ref <140)

## 2015-10-07 MED ORDER — SYNTHROID 88 MCG PO TABS: ORAL_TABLET | ORAL | Status: DC
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
## 2015-10-07 NOTE — Patient Instructions (Signed)
Follow up visit in 6 months. Please repeat thyroid blood tests one week prior to next visit.

## 2015-10-29 ENCOUNTER — Other Ambulatory Visit: Payer: Self-pay | Admitting: *Deleted

## 2015-10-29 DIAGNOSIS — E890 Postprocedural hypothyroidism: Secondary | ICD-10-CM

## 2015-10-29 MED ORDER — SYNTHROID 88 MCG PO TABS: ORAL_TABLET | ORAL | Status: DC

## 2015-12-13 ENCOUNTER — Telehealth: Payer: Self-pay | Admitting: "Endocrinology

## 2015-12-13 NOTE — Telephone Encounter (Signed)
1. I called the patient with her lab results from her last visit. Unfortunately, she was not available. 2. I left a voicemail message. Your TSH was abnormal, but your free T4, free T3, and TSI were normal. I would like you to repeat the labs within the next week. If you have any questions, please call me at the office. Sherrlyn Hock

## 2015-12-17 LAB — T3, FREE: T3, Free: 2.7 pg/mL (ref 2.3–4.2)

## 2015-12-17 LAB — TSH: TSH: 2.35 m[IU]/L

## 2015-12-17 LAB — T4, FREE: FREE T4: 1.4 ng/dL (ref 0.8–1.8)

## 2015-12-22 LAB — THYROID STIMULATING IMMUNOGLOBULIN: TSI: 94 %{baseline} (ref <140)

## 2015-12-28 ENCOUNTER — Encounter: Payer: Self-pay | Admitting: *Deleted

## 2016-03-09 ENCOUNTER — Other Ambulatory Visit: Payer: Self-pay | Admitting: *Deleted

## 2016-03-09 DIAGNOSIS — E034 Atrophy of thyroid (acquired): Secondary | ICD-10-CM

## 2016-04-06 ENCOUNTER — Ambulatory Visit: Payer: BLUE CROSS/BLUE SHIELD | Admitting: "Endocrinology

## 2016-04-13 DIAGNOSIS — E034 Atrophy of thyroid (acquired): Secondary | ICD-10-CM | POA: Diagnosis not present

## 2016-04-13 DIAGNOSIS — E038 Other specified hypothyroidism: Secondary | ICD-10-CM | POA: Diagnosis not present

## 2016-04-14 LAB — T4, FREE: Free T4: 1.3 ng/dL (ref 0.8–1.8)

## 2016-04-14 LAB — TSH: TSH: 3.54 m[IU]/L

## 2016-04-14 LAB — T3, FREE: T3 FREE: 2.4 pg/mL (ref 2.3–4.2)

## 2016-04-20 ENCOUNTER — Ambulatory Visit (INDEPENDENT_AMBULATORY_CARE_PROVIDER_SITE_OTHER): Payer: BLUE CROSS/BLUE SHIELD | Admitting: "Endocrinology

## 2016-04-20 ENCOUNTER — Encounter: Payer: Self-pay | Admitting: "Endocrinology

## 2016-04-20 VITALS — BP 115/78 | HR 101 | Wt 148.8 lb

## 2016-04-20 DIAGNOSIS — E663 Overweight: Secondary | ICD-10-CM

## 2016-04-20 DIAGNOSIS — E063 Autoimmune thyroiditis: Secondary | ICD-10-CM

## 2016-04-20 DIAGNOSIS — E890 Postprocedural hypothyroidism: Secondary | ICD-10-CM

## 2016-04-20 DIAGNOSIS — E349 Endocrine disorder, unspecified: Secondary | ICD-10-CM

## 2016-04-20 DIAGNOSIS — E0500 Thyrotoxicosis with diffuse goiter without thyrotoxic crisis or storm: Secondary | ICD-10-CM | POA: Diagnosis not present

## 2016-04-20 MED ORDER — SYNTHROID 100 MCG PO TABS: ORAL_TABLET | ORAL | Status: DC

## 2016-04-20 MED ORDER — LEVOTHYROXINE SODIUM 100 MCG PO TABS: 100.0000 ug | ORAL_TABLET | Freq: Every day | ORAL | Status: DC
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
## 2016-04-20 NOTE — Patient Instructions (Signed)
Follow up visit in 6 months. Please increase your Synthroid dose to 100 mcg/day. Please repeat thyroid blood tests in September and December.

## 2016-07-07 DIAGNOSIS — E890 Postprocedural hypothyroidism: Secondary | ICD-10-CM | POA: Diagnosis not present

## 2016-07-08 LAB — T4, FREE: FREE T4: 1.2 ng/dL (ref 0.8–1.8)

## 2016-07-08 LAB — T3, FREE: T3, Free: 2.9 pg/mL (ref 2.3–4.2)

## 2016-07-08 LAB — TSH: TSH: 1.08 mIU/L

## 2016-07-13 LAB — THYROID STIMULATING IMMUNOGLOBULIN

## 2016-07-20 ENCOUNTER — Telehealth: Payer: Self-pay | Admitting: "Endocrinology

## 2016-07-20 NOTE — Telephone Encounter (Signed)
Requesting lab results

## 2016-07-21 NOTE — Telephone Encounter (Signed)
Routed to provider

## 2016-07-24 ENCOUNTER — Encounter: Payer: Self-pay | Admitting: *Deleted

## 2016-09-07 NOTE — Telephone Encounter (Signed)
Results sent via [facility].

## 2016-09-08 DIAGNOSIS — Z0000 Encounter for general adult medical examination without abnormal findings: Secondary | ICD-10-CM | POA: Diagnosis not present

## 2016-09-08 DIAGNOSIS — E039 Hypothyroidism, unspecified: Secondary | ICD-10-CM | POA: Diagnosis not present

## 2016-09-08 DIAGNOSIS — Z1231 Encounter for screening mammogram for malignant neoplasm of breast: Secondary | ICD-10-CM | POA: Diagnosis not present

## 2016-10-11 DIAGNOSIS — E890 Postprocedural hypothyroidism: Secondary | ICD-10-CM | POA: Diagnosis not present

## 2016-10-12 LAB — T3, FREE: T3, Free: 2.7 pg/mL (ref 2.3–4.2)

## 2016-10-12 LAB — T4, FREE: FREE T4: 1.2 ng/dL (ref 0.8–1.8)

## 2016-10-12 LAB — TSH: TSH: 0.74 m[IU]/L

## 2016-10-23 ENCOUNTER — Ambulatory Visit (INDEPENDENT_AMBULATORY_CARE_PROVIDER_SITE_OTHER): Payer: BLUE CROSS/BLUE SHIELD | Admitting: "Endocrinology

## 2016-10-23 ENCOUNTER — Encounter (INDEPENDENT_AMBULATORY_CARE_PROVIDER_SITE_OTHER): Payer: Self-pay | Admitting: "Endocrinology

## 2016-10-23 VITALS — BP 120/86 | HR 102 | Wt 152.2 lb

## 2016-10-23 DIAGNOSIS — O0990 Supervision of high risk pregnancy, unspecified, unspecified trimester: Secondary | ICD-10-CM | POA: Insufficient documentation

## 2016-10-23 DIAGNOSIS — E063 Autoimmune thyroiditis: Secondary | ICD-10-CM

## 2016-10-23 DIAGNOSIS — E890 Postprocedural hypothyroidism: Secondary | ICD-10-CM

## 2016-10-23 DIAGNOSIS — E349 Endocrine disorder, unspecified: Secondary | ICD-10-CM

## 2016-10-23 DIAGNOSIS — E663 Overweight: Secondary | ICD-10-CM | POA: Diagnosis not present

## 2016-10-23 DIAGNOSIS — O0991 Supervision of high risk pregnancy, unspecified, first trimester: Secondary | ICD-10-CM | POA: Diagnosis not present

## 2016-10-23 DIAGNOSIS — E049 Nontoxic goiter, unspecified: Secondary | ICD-10-CM | POA: Diagnosis not present

## 2016-10-23 LAB — CBC WITH DIFFERENTIAL/PLATELET
BASOS PCT: 0 %
Basophils Absolute: 0 cells/uL (ref 0–200)
EOS PCT: 3 %
Eosinophils Absolute: 273 cells/uL (ref 15–500)
HCT: 43.3 % (ref 35.0–45.0)
Hemoglobin: 14.4 g/dL (ref 11.7–15.5)
Lymphocytes Relative: 30 %
Lymphs Abs: 2730 cells/uL (ref 850–3900)
MCH: 30.7 pg (ref 27.0–33.0)
MCHC: 33.3 g/dL (ref 32.0–36.0)
MCV: 92.3 fL (ref 80.0–100.0)
MONOS PCT: 8 %
MPV: 10.1 fL (ref 7.5–12.5)
Monocytes Absolute: 728 cells/uL (ref 200–950)
NEUTROS ABS: 5369 {cells}/uL (ref 1500–7800)
Neutrophils Relative %: 59 %
PLATELETS: 370 10*3/uL (ref 140–400)
RBC: 4.69 MIL/uL (ref 3.80–5.10)
RDW: 13.2 % (ref 11.0–15.0)
WBC: 9.1 10*3/uL (ref 3.8–10.8)

## 2016-10-23 NOTE — Patient Instructions (Signed)
Follow up visit in 2 months. Please repeat lab tests one week prior.  
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
## 2016-10-24 ENCOUNTER — Telehealth (INDEPENDENT_AMBULATORY_CARE_PROVIDER_SITE_OTHER): Payer: Self-pay | Admitting: "Endocrinology

## 2016-10-24 LAB — COMPREHENSIVE METABOLIC PANEL
ALT: 11 U/L (ref 6–29)
AST: 15 U/L (ref 10–30)
Albumin: 4.2 g/dL (ref 3.6–5.1)
Alkaline Phosphatase: 68 U/L (ref 33–115)
BUN: 7 mg/dL (ref 7–25)
CHLORIDE: 105 mmol/L (ref 98–110)
CO2: 24 mmol/L (ref 20–31)
CREATININE: 0.73 mg/dL (ref 0.50–1.10)
Calcium: 9.3 mg/dL (ref 8.6–10.2)
Glucose, Bld: 78 mg/dL (ref 70–99)
Potassium: 4.1 mmol/L (ref 3.5–5.3)
SODIUM: 137 mmol/L (ref 135–146)
TOTAL PROTEIN: 7.4 g/dL (ref 6.1–8.1)
Total Bilirubin: 0.3 mg/dL (ref 0.2–1.2)

## 2016-10-24 LAB — PTH, INTACT AND CALCIUM
CALCIUM: 9.3 mg/dL (ref 8.6–10.2)
PTH: 83 pg/mL — ABNORMAL HIGH (ref 14–64)

## 2016-10-24 LAB — VITAMIN D 25 HYDROXY (VIT D DEFICIENCY, FRACTURES): VIT D 25 HYDROXY: 10 ng/mL — AB (ref 30–100)

## 2016-10-24 LAB — HCG, QUANTITATIVE, PREGNANCY: hCG, Beta Chain, Quant, S: 338.5 m[IU]/mL — ABNORMAL HIGH

## 2016-10-24 NOTE — Telephone Encounter (Signed)
1. I called to give the patient her recent lab results. Her qualitative hCG was elevated, c/w pregnancy. Her CMP and CBC were normal. Her vitamin D is low at 10, her PTH is elevated at 83, and her calcium is 9.3, but really needs to be about 10.0 to be able to transfer enough calcium to the baby. 2. I asked her to order [employer] brand of vitamin D, one 50,000 unit capsule weekly. I also asked ht to take Citracal, 500 or 600 mg, twice daily at lunch and dinner or at dinner and bedtime. She agreed.   Tillman Sers, MD, CDE Adult and Pediatric Endocrinology

## 2016-10-25 LAB — THYROID STIMULATING IMMUNOGLOBULIN

## 2016-10-26 ENCOUNTER — Other Ambulatory Visit (INDEPENDENT_AMBULATORY_CARE_PROVIDER_SITE_OTHER): Payer: Self-pay | Admitting: *Deleted

## 2016-10-26 DIAGNOSIS — E0590 Thyrotoxicosis, unspecified without thyrotoxic crisis or storm: Secondary | ICD-10-CM

## 2016-10-27 DIAGNOSIS — O3680X0 Pregnancy with inconclusive fetal viability, not applicable or unspecified: Secondary | ICD-10-CM | POA: Diagnosis not present

## 2016-10-27 DIAGNOSIS — N939 Abnormal uterine and vaginal bleeding, unspecified: Secondary | ICD-10-CM | POA: Diagnosis not present

## 2016-10-27 DIAGNOSIS — O200 Threatened abortion: Secondary | ICD-10-CM | POA: Diagnosis not present

## 2016-10-31 DIAGNOSIS — E0590 Thyrotoxicosis, unspecified without thyrotoxic crisis or storm: Secondary | ICD-10-CM | POA: Diagnosis not present

## 2016-11-02 LAB — THYROID STIMULATING IMMUNOGLOBULIN: TSI: 147 %{baseline} — AB (ref <140)

## 2016-11-03 DIAGNOSIS — O3680X0 Pregnancy with inconclusive fetal viability, not applicable or unspecified: Secondary | ICD-10-CM | POA: Diagnosis not present

## 2016-11-07 ENCOUNTER — Encounter (INDEPENDENT_AMBULATORY_CARE_PROVIDER_SITE_OTHER): Payer: Self-pay | Admitting: *Deleted

## 2016-11-12 ENCOUNTER — Telehealth (INDEPENDENT_AMBULATORY_CARE_PROVIDER_SITE_OTHER): Payer: Self-pay | Admitting: "Endocrinology

## 2016-11-12 NOTE — Telephone Encounter (Signed)
I tried to call the patient, but she was unavailable. I left a voicemail message asking her to return my call tomorrow.  Tillman Sers, MD, CDE

## 2016-11-13 ENCOUNTER — Telehealth (INDEPENDENT_AMBULATORY_CARE_PROVIDER_SITE_OTHER): Payer: Self-pay | Admitting: "Endocrinology

## 2016-11-13 ENCOUNTER — Other Ambulatory Visit (INDEPENDENT_AMBULATORY_CARE_PROVIDER_SITE_OTHER): Payer: Self-pay | Admitting: "Endocrinology

## 2016-11-13 DIAGNOSIS — O200 Threatened abortion: Secondary | ICD-10-CM | POA: Diagnosis not present

## 2016-11-13 DIAGNOSIS — E890 Postprocedural hypothyroidism: Secondary | ICD-10-CM

## 2016-11-13 NOTE — Telephone Encounter (Signed)
1. I called Pat to discuss her thyroid hormone status 2. Subjective: She had a spontaneous abortion last week. She is taking Synthroid, 100 mcg/day for 6 days each week, plus 50 mcg each Sunday. She feels good overall. 3. Objective: Her TFTs on 10/11/16 were at about the 70-75% of the normal range at her current Synthroid doses. At that point she was about two weeks pregnant. 4. Assessment: Since she was not far along in her pregnancy and we had not had to increase her Synthroid doses to compensate for the placental deiodinase degradation of her thyroid hormone levels that wold have occurred in the third trimester, it makes sense to continue her current Synthroid doses. 5. Plan: She will have her next set of TFTs drawn when she comes in for her next appointment in early March. Based on those results we will adjust her Synthroid dose as indicated.  Tillman Sers, MD, CDE

## 2016-11-17 DIAGNOSIS — Z1231 Encounter for screening mammogram for malignant neoplasm of breast: Secondary | ICD-10-CM | POA: Diagnosis not present

## 2016-12-26 DIAGNOSIS — Z8759 Personal history of other complications of pregnancy, childbirth and the puerperium: Secondary | ICD-10-CM | POA: Diagnosis not present

## 2016-12-26 DIAGNOSIS — Z331 Pregnant state, incidental: Secondary | ICD-10-CM | POA: Diagnosis not present

## 2016-12-26 DIAGNOSIS — N925 Other specified irregular menstruation: Secondary | ICD-10-CM | POA: Diagnosis not present

## 2016-12-28 DIAGNOSIS — O200 Threatened abortion: Secondary | ICD-10-CM | POA: Diagnosis not present

## 2016-12-29 ENCOUNTER — Ambulatory Visit (INDEPENDENT_AMBULATORY_CARE_PROVIDER_SITE_OTHER): Payer: BLUE CROSS/BLUE SHIELD | Admitting: "Endocrinology

## 2017-01-01 DIAGNOSIS — E890 Postprocedural hypothyroidism: Secondary | ICD-10-CM | POA: Diagnosis not present

## 2017-01-02 LAB — TSH: TSH: 4.83 mIU/L — ABNORMAL HIGH

## 2017-01-02 LAB — T3, FREE: T3, Free: 2.2 pg/mL — ABNORMAL LOW (ref 2.3–4.2)

## 2017-01-02 LAB — HCG, SERUM, QUALITATIVE: Preg, Serum: UNDETERMINED — ABNORMAL HIGH

## 2017-01-02 LAB — T4, FREE: Free T4: 1 ng/dL (ref 0.8–1.8)

## 2017-01-03 DIAGNOSIS — D259 Leiomyoma of uterus, unspecified: Secondary | ICD-10-CM | POA: Diagnosis not present

## 2017-01-03 DIAGNOSIS — O039 Complete or unspecified spontaneous abortion without complication: Secondary | ICD-10-CM | POA: Diagnosis not present

## 2017-01-04 LAB — THYROID STIMULATING IMMUNOGLOBULIN: TSI: 97 % baseline (ref <140)

## 2017-01-05 ENCOUNTER — Encounter (INDEPENDENT_AMBULATORY_CARE_PROVIDER_SITE_OTHER): Payer: Self-pay | Admitting: "Endocrinology

## 2017-01-05 ENCOUNTER — Ambulatory Visit (INDEPENDENT_AMBULATORY_CARE_PROVIDER_SITE_OTHER): Payer: BLUE CROSS/BLUE SHIELD | Admitting: "Endocrinology

## 2017-01-05 VITALS — BP 118/80 | HR 80 | Wt 155.0 lb

## 2017-01-05 DIAGNOSIS — N96 Recurrent pregnancy loss: Secondary | ICD-10-CM | POA: Diagnosis not present

## 2017-01-05 DIAGNOSIS — E211 Secondary hyperparathyroidism, not elsewhere classified: Secondary | ICD-10-CM

## 2017-01-05 DIAGNOSIS — E890 Postprocedural hypothyroidism: Secondary | ICD-10-CM

## 2017-01-05 DIAGNOSIS — E663 Overweight: Secondary | ICD-10-CM

## 2017-01-05 DIAGNOSIS — E063 Autoimmune thyroiditis: Secondary | ICD-10-CM | POA: Diagnosis not present

## 2017-01-05 DIAGNOSIS — E0500 Thyrotoxicosis with diffuse goiter without thyrotoxic crisis or storm: Secondary | ICD-10-CM

## 2017-01-05 DIAGNOSIS — E559 Vitamin D deficiency, unspecified: Secondary | ICD-10-CM

## 2017-01-05 DIAGNOSIS — E349 Endocrine disorder, unspecified: Secondary | ICD-10-CM | POA: Diagnosis not present
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
## 2017-01-05 NOTE — Patient Instructions (Signed)
Follow up visit in 3 months. Please repeat lab tests two weeks prior to next visit. Please order Biotech brand of vitamin D, 50,000 IU capsules, one per week.

## 2017-02-15 ENCOUNTER — Other Ambulatory Visit (INDEPENDENT_AMBULATORY_CARE_PROVIDER_SITE_OTHER): Payer: Self-pay | Admitting: "Endocrinology

## 2017-02-15 DIAGNOSIS — E890 Postprocedural hypothyroidism: Secondary | ICD-10-CM

## 2017-03-30 DIAGNOSIS — E890 Postprocedural hypothyroidism: Secondary | ICD-10-CM | POA: Diagnosis not present

## 2017-03-30 DIAGNOSIS — E211 Secondary hyperparathyroidism, not elsewhere classified: Secondary | ICD-10-CM | POA: Diagnosis not present

## 2017-03-31 LAB — TSH: TSH: 0.82 mIU/L

## 2017-03-31 LAB — T4, FREE: Free T4: 1.4 ng/dL (ref 0.8–1.8)

## 2017-03-31 LAB — VITAMIN D 25 HYDROXY (VIT D DEFICIENCY, FRACTURES): VIT D 25 HYDROXY: 57 ng/mL (ref 30–100)

## 2017-03-31 LAB — T3, FREE: T3, Free: 2.6 pg/mL (ref 2.3–4.2)

## 2017-04-03 LAB — PTH, INTACT AND CALCIUM
Calcium: 9.7 mg/dL (ref 8.6–10.2)
PTH: 47 pg/mL (ref 14–64)

## 2017-04-05 LAB — THYROID STIMULATING IMMUNOGLOBULIN: TSI: 114 % baseline (ref <140)

## 2017-04-13 ENCOUNTER — Encounter (INDEPENDENT_AMBULATORY_CARE_PROVIDER_SITE_OTHER): Payer: Self-pay | Admitting: "Endocrinology

## 2017-04-13 ENCOUNTER — Ambulatory Visit (INDEPENDENT_AMBULATORY_CARE_PROVIDER_SITE_OTHER): Payer: BLUE CROSS/BLUE SHIELD | Admitting: "Endocrinology

## 2017-04-13 VITALS — BP 116/78 | HR 82 | Wt 157.0 lb

## 2017-04-13 DIAGNOSIS — E211 Secondary hyperparathyroidism, not elsewhere classified: Secondary | ICD-10-CM

## 2017-04-13 DIAGNOSIS — E669 Obesity, unspecified: Secondary | ICD-10-CM | POA: Insufficient documentation

## 2017-04-13 DIAGNOSIS — E890 Postprocedural hypothyroidism: Secondary | ICD-10-CM | POA: Diagnosis not present

## 2017-04-13 DIAGNOSIS — R1013 Epigastric pain: Secondary | ICD-10-CM

## 2017-04-13 DIAGNOSIS — E6609 Other obesity due to excess calories: Secondary | ICD-10-CM

## 2017-04-13 DIAGNOSIS — E663 Overweight: Secondary | ICD-10-CM

## 2017-04-13 DIAGNOSIS — E349 Endocrine disorder, unspecified: Secondary | ICD-10-CM

## 2017-04-13 DIAGNOSIS — E0500 Thyrotoxicosis with diffuse goiter without thyrotoxic crisis or storm: Secondary | ICD-10-CM

## 2017-04-13 DIAGNOSIS — R5383 Other fatigue: Secondary | ICD-10-CM

## 2017-04-13 DIAGNOSIS — E559 Vitamin D deficiency, unspecified: Secondary | ICD-10-CM | POA: Diagnosis not present

## 2017-04-13 MED ORDER — RANITIDINE HCL 150 MG PO TABS: 150.0000 mg | ORAL_TABLET | Freq: Two times a day (BID) | ORAL | 6 refills | Status: DC

## 2017-04-13 NOTE — Patient Instructions (Signed)
Follow up visit in 3 months, or earlier if she becomes pregnant. Call if she becomes pregnant. Please repeat labs one week prior to next visit.

## 2017-04-13 NOTE — Progress Notes (Signed)
Subjective:  Patient Name: Jessica Maxwell Date of Birth: May 31, 1976  MRN: [id number]  Jessica Maxwell  presents to the office today for follow-up of her hypothyroidism s/p I-131 therapy for Graves' disease, exophthalmos, goiter, Hashimoto's thyroiditis, weight loss, fatigue, oligomenorrhea, menometrorrhagia, low libido, hirsutism.  HISTORY OF PRESENT ILLNESS:   Jessica Maxwell is a 41 y.o. African-American woman.  Jessica Maxwell was unaccompanied.  1. The patient was referred to me on 11/07/2005 by Dr. Milagros Evener of the [facility] for evaluation and management of  thyrotoxicosis caused by Graves' disease, exophthalmos, weight loss, tachycardia, and tremor. The patient was then 41 years old.  A. The patient had begun to develop symptoms and signs of thyrotoxicosis at age 20-19. By the Summer of  2006 she had begun to miss menstrual periods. She saw Dr. Radene Ou in October 2006. Dr. Radene Ou diagnosed Graves disease clinically and ordered lab tests. On 08/28/05 the TSH was 0.022. Free T4 was 6.28. Dr. Radene Ou started the patient on methimazole, which reduced the free T4 to 1.6 by 09/19/05, but the patient developed an extensive rash on day 21 of therapy, so Dr. Radene Ou appropriately discontinued the medication on 09/22/05.  Thyroid US on 09/04/05 showed a 10x16 mm nodule in the right superior pole. Echotexture in both lobes was inhomogeneous. Radioactive iodine uptake on 10/06/05 was 79% (normal 15-30%). The previously identified "nodule" had avid uptake, similar to the uptake in the remainder of the thyroid gland. The radiologic impression was: "findings compatible with diffuse toxic goiter". Dr. Radene Ou also treated her with atenolol, 50 mg/day.  B. At the time the patient first saw me, she was quite hyperthyroid again. Her past medical history was remarkable only for a unilateral salpingo-oophorectomy for an ectopic pregnancy. Her family history was positive for a paternal grandfather who had been  diagnosed with Graves' disease and exophthalmos and a maternal aunt who had been diagnosed with hypothyroidism.  C. On physical exam, her weight was 125 pounds, her BP was 130/88. Her heart rate was 96. Her eyes were very prominent and she had both inferior proptosis and stare. She had 1+ tongue tremor and 2+ carotid/thyroid bruits. Her thyroid gland was 30-35 grams in size and had a lobulated, firm consistency. She had a grade III/VI systolic flow murmur. She also had 1+ tremor of her outstretched fingers. TSH was.020. Free T4 was 5.0. T3 was 816 (normal 60-181). TPO antibody was 3,873.2 (normal <40-60). TSI was 3.1 (normal < 1.3). Alkaline phosphatase was 179 (normal 39-117), consistent with increased bone turnover due to hyperthyroidism.   D.The patient had a combination of both Graves' Disease with exophthalmos and Hashimoto's Thyroiditis. Her Graves' disease was clearly dominant. Due to her allergic reaction to methimazole, it was quite likely that she might have a similar reaction to propylthiouracil (PTU). I discussed the advantages and disadvantages with her of PTU therapy, I-131 therapy, and thyroidectomy. I recommended I-131 therapy and she concurred. On 01/26/06 she was treated with 12.55 mCi of I-131. Unfortunately, that dose did not destroy enough thyrocytes so she required a second dose of 26.3 mCi of I-131 on 10/12/06.  By 0/18/08 she was hypothyroid, with a TSH of 19.9, free T4 of 0.46, and free T3 of 1.2. I started her on a daily dose of 88 mcg of Synthroid at that time.   2. During the next 2 years the patient's Synthroid dose was progressively increased to 137 mcg/day. However, in 2010 I began reducing her Synthroid dose because she appeared to be having a partial  re-activation of her Graves' Disease. She was often non-compliant with taking her Synthroid. In May 2011 she stopped Synthroid completely because she "felt crazy".   3. At the time of her [facility] visit on 10/05/10, she was taking  Synthroid only sporadically. TSH was 135.19. Free T4 was 0.29. Free T3 was 1.2. TSI was 168 (normal <140). I asked her to take 88 mcg of Synthroid per day.  4. On 12/08/10 she called me. She had had an explosive rage incident at work. She denied taking any illicit drugs, but stated that she was having a lot of trouble sleeping, and so was drinking a cupful of daiquiris several nights per week. TFTs done that day showed a TSH of 108.95, free T4 of 0.56, and free T3 of 1.4. Her TSI was 137. She was still quite hypothyroid, so her rage episode was not caused by her being hyperthyroid. I called her with these results. I made samples of the 88 mcg tablets available to her. I asked her to take her Synthroid daily, to have repeat TFTs done in 2 months, and to keep her appointment on March 1st 2012 as planned. She did not keep that appointment and did not have the labs done. She stated that she did continue to take the 88 mcg Synthroid tablets reliably. She apparently made a follow-up appointment in January 2013, but I was not available. TFTs on 12/06/11 showed a TSH of 2.456, free T4 of 1.49, and free T3 of 2.7. TSI was 82. She did have a FU appointment with me on 02/20/12. I continued her on her Synthroid dose of 88 mcg/day. TFTs on 08/05/12 showed a TSH of 3.072, free T4 1.22, and free T3 3.0. I again continued her on Synthroid, 88 mcg/day. She was subsequently seen in FU in April and October of 2014. Her Synthroid dose was eventually increased back to 100 mcg/day, then decreased to 100 mcg/day for 6 days each week.    5. The patient's last [facility] visit was on 01/05/17. At that visit I increased her Synthroid dose to  100 mcg/day every day.   A. In the interim she has been healthy, but is more tired. She is sleeping well. She drinks " a lot" of tea.    B. She is not pregnant and does not plan to become pregnant. However, she and her boyfriend are not using any birth control at this time.     C. Her seasonal allergies  have not been very active.    D. The patient states she still takes the 100 mcg Synthroid tablets daily. She takes her Synthroid about 5 AM daily. She takes her [employer] vitamin D, 50,000 IU, once weekly about 5 PM. She stopped taking a MVI.    E. She bought a bike, and tries to ride most evenings for about 15 minutes. She is still eating out at restaurants a lot.    6. Review of Systems: She has been fairly healthy.  Constitutional: The patient feels "tired, but still jittery". Energy level is low.  Eyes: Eyes are about the same. She can see fairly well without her glasses, but her vision is not as good for distance. She feels that her eyes are less prominent. She has no complaints of ocular pressure with "up and out" gaze in either eye. There are no significant eye complaints.  Neck: The patient has no complaints of anterior neck swelling, soreness, tenderness,  pressure, discomfort, or difficulty swallowing.  Heart: Heart rate increases with exercise  or other physical activity. The patient has no complaints of palpitations, irregular heat beats, chest pain, or chest pressure. Gastrointestinal: She seems to have persistent acid indigestion and some reflux. She is also eating more carbs. The patient has no complaints of stomach aches or pains, diarrhea, or constipation. Legs: Muscle mass and strength seem normal. There are no complaints of numbness, tingling, burning, or pain. No edema is noted. Feet: There are no obvious foot problems. There are no complaints of numbness, tingling, burning, or pain. No edema is noted. GYN: LMP was 04/08/17. Periods are regular.    PAST MEDICAL, FAMILY, AND SOCIAL HISTORY:  Past Medical History:  Diagnosis Date  . Fatigue   . Hypothyroid   . Hypothyroidism following radioiodine therapy   . Menometrorrhagia   . Secondary oligomenorrhea   . Thyroiditis, autoimmune   . Thyrotoxicosis with diffuse goiter   . Toxic diffuse goiter with exophthalmos     Family  History  Problem Relation Age of Onset  . Heart disease Father   . Cancer Father        small bowel cancer   . Thyroid disease Maternal Aunt        hypothyroid  . Heart disease Maternal Aunt   . Heart disease Paternal Aunt   . Thyroid disease Paternal Grandfather        hyperthyroid  . Heart disease Paternal Grandfather   . Diabetes Neg Hx      Current Outpatient Prescriptions:  .  loratadine (CLARITIN) 10 MG tablet, Take 10 mg by mouth daily., Disp: , Rfl:  .  SYNTHROID 100 MCG tablet, TAKE 1 TABLET BY MOUTH EVERY DAY, Disp: 30 tablet, Rfl: 2  Allergies as of 04/13/2017 - Review Complete 01/05/2017  Allergen Reaction Noted  . Tapazole [methimazole] Hives 04/20/2011    1. 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 She loves her job. She moved back to [place] and lives with her boyfriend. She still plans to go back to school for her RN degree.  2. Activities: She stopped going to the gym, but walks a lot at work.  3. Smoking, alcohol, or drugs: None 4. Primary Care Provider: Dr. Briscoe Deutscher at [facility] at [place], fax [phone] 5. OB-GYN: [facility] on [address]: There are no other significant problems involving Jessica Maxwell's other body systems.   Objective:  Vital Signs:  BP 116/78   Pulse 82   Wt 157 lb (71.2 kg)   BMI 29.66 kg/m  Repeat HR 112   Ht Readings from Last 3 Encounters:  02/20/14 5\' 1"  (1.549 m)  10/06/13 5\' 1"  (1.549 m)  07/23/13 5\' 1"  (1.549 m)   Wt Readings from Last 3 Encounters:  04/13/17 157 lb (71.2 kg)  01/05/17 155 lb (70.3 kg)  10/23/16 152 lb 3.2 oz (69 kg)   HC Readings from Last 3 Encounters:  No data found for [facility]   Body surface area is 1.75 meters squared.  Facility age limit for growth percentiles is 20 years. Facility age limit for growth percentiles is 20 years.   PHYSICAL EXAM:  Constitutional: The patient appears healthy, happy, but heavier. She no  longer looks tired. Her affect and insight are normal. She has gained 2 pounds in 3 months. Her weight is >130 % of her IBW, indicating that her weight is in the obese zone  Face: The face appears normal. Eyes: Her eyes are still somewhat prominent,  with the left eye being slightly more prominent. There is no obvious arcus. She has no inferior proptosis of her eyes today. Extra-ocular movements are normal on both sides. Moisture appears normal. Mouth: The oropharynx and tongue appear normal. Oral moisture is normal. She has a trace mustache. She plucks.  Neck: The neck appears to be visibly normal. No carotid bruits are noted. The thyroid gland is still slightly enlarged today at about 20+ grams in size. The right lobe has shrunk back to normal in size, but the left lobe is again mildly enlarged. The consistency of the thyroid gland is normal. The thyroid gland is not tender to palpation. Lungs: The lungs are clear to auscultation. Air movement is good. Heart: Heart rate and rhythm are regular. Heart sounds S1 and S2 are normal. I did not appreciate any pathologic cardiac murmurs. Abdomen: The abdomen is more enlarged. Bowel sounds are normal. There is no obvious hepatomegaly, splenomegaly, or other mass effect.  Arms: Muscle size and bulk are normal for age. Hands: There is no obvious tremor. Phalangeal and metacarpophalangeal joints are normal. Palmar muscles are normal. Palmar skin is normal. Palmar moisture is also normal. Legs: Muscles appear normal for age. No edema is present. Neurologic: Strength is normal for age in both the upper and lower extremities. Muscle tone is normal. Sensation to touch is normal in both legs.    LAB DATA:  Labs 03/30/17: TSI 114 (ref <140), TSH 0.82, free T4 1.4, free T3 2.6; calcium 9.7, PTH 47, 25-OH vitamin D 57  Labs 01/02/17: TSH 4.83, free T4 1.0, free T3 2.2, TSI 147 (ref <140)  Labs 10/23/16: PTH 83 (ref 14-64), calcium 9.3, 25-OH vitamin D 10 (ref  30-100)  Labs 10/11/16: TSH 0.74, free T4 1.2, free T3 2.7, TSI 147  Labs 07/07/16: TSH 1.08, free T4 1.2, free T3 2.9, TSI < 89  Labs 04/13/16: TSH 3.54, free T4 1.30, free T3 2.4  Labs 12/17/15: TSH 2.35, free T4 1.40, free T3 2.7, TSI 94  Labs 10/04/15: TSH 4.290, free T4 1.15, free T3 2.4, TSI 62 (ref <140)  Labs 03/30/15:TSH 0.382, free T4 1.29, free T3 3.1  Labs 08/19/14: TSH 1.027, free T4 1.07, free T3 2.6  Labs 05/20/14: TSH 0.736, free T4 1.34, free T3 3.1,   Labs 10/20/13; TSH 2.414, free T4 1.2, free T3 2.8  Labs 08/05/13: TSH 8.221, free T4 1.18, free T3 2.5. She may have missed the half-dose last Sunday.  Labs 04/25/13: TSH 0.225, free T4 1.30, free T3 2.8  Labs 02/17/13: TSH 3.069, free T4 1.27, free T3 2.5  Labs 08/06/12: TSH 3.072, free T4 1.22, free T3 2.7  Labs 12/12/11: TSH 2.456, free T4 1.49, Free T3 2.7, FSH 2.9, LH 3.5, TSI 82 (<140%),     Assessment and Plan:   ASSESSMENT:  1-3. Hypothyroid, s/p I-131 therapy for Graves' disease on 3/23/7 and 10/12/06/ Graves' Dz/Hashimoto's thyroiditis    A. Patient's TFTs and TSI levels have continued to fluctuate over time. We have had to adjust her Synthroid doses over time to compensate for changes in TSI and TFTs levels.   B. At her last visit in March 2018, she was mildly hypothyroid, so I increased her Synthroid dose by about 7% to 100 mcg/day every day.   D. Her TSI in May 2018 was back within normal limits, but still relatively high. Her labs in May 2018 were normal at about 70% of the true normal thyroid hormone range. .  E. Her  Graves Dz is still fighting with her Hashimoto's Dz for dominance. We are doing our best to keep her Synthroid doses appropriate for her needs 4. Goiter: Her thyroid gland is unchanged in size today. The process of waxing and waning of thyroid gland size is c/w evolving Hashimoto' disease.  5. Exophthalmos: Her left eye is much less prominent today. There is no eye proptosis, lid lag, or  restriction of extra-ocular muscle movements in either eye today. Her TSI level is normal, but still relatively high.  6. Oligomenorrhea and menometrorrhagia: Resolved 7. Overweight: She needs more exercise and fewer carbs.  8-9. Vitamin D deficiency/hyperparathyroidism: Her December 2017 labs showed that she was hyperparathyroid secondary to vitamin D deficiency disease. Since starting [employer], however, her lab tests in may 2018 showed that her vitamin D level has normalized and her PTH level has normalized. I would like her to resume a good quality MVI per day such as Centrum for women or One-A -Day for women.  10. Dyspepsia: This remains a problem for her. We will start ranitidine, 150 mg, twice daily. 11. Fatigue, other. She may have too much caffeine and theobromine in her brain when she tries to sleep. She could also have anemia and/or iron deficiency.  PLAN:  1. Diagnostic: TFTs, CBC, and iron in 3 months.  2. Therapeutic: Continue the Synthroid dose of 100 mcg/day every day. Continue [employer]. Resume a good MVI daily. Start ranitidine, 150 mg, twice daily.    3. Patient education: We discussed issus of Graves' Dz, hashimoto's Dz, and he need to adjust her Synthroid doses accordingly. We also discussed that if she were to become pregnant again, she would need for more Synthroid during the pregnancy.   4. Follow-up: 3 months, or earlier if she becomes pregnant again. Call me ff she becomes pregnant again.   Level of Service: This visit lasted in excess of 45 minutes. More than 50% of the visit was devoted to counseling.  Tillman Sers, MD, CDE Adult and Pediatric Endocrinology 04/13/2017 8:59 AM

## 2017-05-19 ENCOUNTER — Other Ambulatory Visit (INDEPENDENT_AMBULATORY_CARE_PROVIDER_SITE_OTHER): Payer: Self-pay | Admitting: "Endocrinology

## 2017-05-19 DIAGNOSIS — E890 Postprocedural hypothyroidism: Secondary | ICD-10-CM

## 2017-07-06 DIAGNOSIS — R5383 Other fatigue: Secondary | ICD-10-CM | POA: Diagnosis not present

## 2017-07-06 DIAGNOSIS — E890 Postprocedural hypothyroidism: Secondary | ICD-10-CM | POA: Diagnosis not present

## 2017-07-06 LAB — CBC WITH DIFFERENTIAL/PLATELET
BASOS ABS: 0 {cells}/uL (ref 0–200)
Basophils Relative: 0 %
EOS ABS: 279 {cells}/uL (ref 15–500)
EOS PCT: 3 %
HCT: 40.8 % (ref 35.0–45.0)
HEMOGLOBIN: 13.6 g/dL (ref 11.7–15.5)
LYMPHS PCT: 34 %
Lymphs Abs: 3162 cells/uL (ref 850–3900)
MCH: 31.2 pg (ref 27.0–33.0)
MCHC: 33.3 g/dL (ref 32.0–36.0)
MCV: 93.6 fL (ref 80.0–100.0)
MPV: 9.5 fL (ref 7.5–12.5)
Monocytes Absolute: 558 cells/uL (ref 200–950)
Monocytes Relative: 6 %
NEUTROS PCT: 57 %
Neutro Abs: 5301 cells/uL (ref 1500–7800)
Platelets: 345 10*3/uL (ref 140–400)
RBC: 4.36 MIL/uL (ref 3.80–5.10)
RDW: 13.4 % (ref 11.0–15.0)
WBC: 9.3 10*3/uL (ref 3.8–10.8)

## 2017-07-07 LAB — TSH: TSH: 1.35 m[IU]/L

## 2017-07-07 LAB — T4, FREE: FREE T4: 1.4 ng/dL (ref 0.8–1.8)

## 2017-07-07 LAB — IRON: IRON: 101 ug/dL (ref 40–190)

## 2017-07-07 LAB — T3, FREE: T3 FREE: 3.2 pg/mL (ref 2.3–4.2)

## 2017-07-20 ENCOUNTER — Ambulatory Visit (INDEPENDENT_AMBULATORY_CARE_PROVIDER_SITE_OTHER): Payer: BLUE CROSS/BLUE SHIELD | Admitting: "Endocrinology

## 2017-07-20 ENCOUNTER — Encounter (INDEPENDENT_AMBULATORY_CARE_PROVIDER_SITE_OTHER): Payer: Self-pay | Admitting: "Endocrinology

## 2017-07-20 VITALS — BP 112/74 | HR 88 | Wt 161.0 lb

## 2017-07-20 DIAGNOSIS — E063 Autoimmune thyroiditis: Secondary | ICD-10-CM | POA: Diagnosis not present

## 2017-07-20 DIAGNOSIS — E669 Obesity, unspecified: Secondary | ICD-10-CM

## 2017-07-20 DIAGNOSIS — E0500 Thyrotoxicosis with diffuse goiter without thyrotoxic crisis or storm: Secondary | ICD-10-CM | POA: Diagnosis not present

## 2017-07-20 DIAGNOSIS — K219 Gastro-esophageal reflux disease without esophagitis: Secondary | ICD-10-CM

## 2017-07-20 DIAGNOSIS — H0520 Unspecified exophthalmos: Secondary | ICD-10-CM | POA: Diagnosis not present

## 2017-07-20 DIAGNOSIS — E890 Postprocedural hypothyroidism: Secondary | ICD-10-CM | POA: Diagnosis not present

## 2017-07-20 DIAGNOSIS — E211 Secondary hyperparathyroidism, not elsewhere classified: Secondary | ICD-10-CM | POA: Diagnosis not present

## 2017-07-20 DIAGNOSIS — E559 Vitamin D deficiency, unspecified: Secondary | ICD-10-CM | POA: Diagnosis not present

## 2017-07-20 DIAGNOSIS — R5383 Other fatigue: Secondary | ICD-10-CM

## 2017-07-20 DIAGNOSIS — R1013 Epigastric pain: Secondary | ICD-10-CM

## 2017-07-20 DIAGNOSIS — IMO0001 Reserved for inherently not codable concepts without codable children: Secondary | ICD-10-CM

## 2017-07-20 NOTE — Progress Notes (Signed)
Subjective:  Patient Name: Jessica Maxwell Date of Birth: May 31, 1976  MRN: [id number]  Jessica Maxwell  presents to the office today for follow-up of her hypothyroidism s/p I-131 therapy for Graves' disease, exophthalmos, goiter, Hashimoto's thyroiditis, weight loss, fatigue, oligomenorrhea, menometrorrhagia, low libido, hirsutism.  HISTORY OF PRESENT ILLNESS:   Jessica Maxwell is a 41 y.o. African-American woman.  [name] was unaccompanied.  1. The patient was referred to me on 11/07/2005 by Dr. Milagros Evener of the [facility] for evaluation and management of  thyrotoxicosis caused by Graves' disease, exophthalmos, weight loss, tachycardia, and tremor. The patient was then 41 years old.  A. The patient had begun to develop symptoms and signs of thyrotoxicosis at age 20-19. By the Summer of  2006 she had begun to miss menstrual periods. She saw Dr. Radene Ou in October 2006. Dr. Radene Ou diagnosed Graves disease clinically and ordered lab tests. On 08/28/05 the TSH was 0.022. Free T4 was 6.28. Dr. Radene Ou started the patient on methimazole, which reduced the free T4 to 1.6 by 09/19/05, but the patient developed an extensive rash on day 21 of therapy, so Dr. Radene Ou appropriately discontinued the medication on 09/22/05.  Thyroid US on 09/04/05 showed a 10x16 mm nodule in the right superior pole. Echotexture in both lobes was inhomogeneous. Radioactive iodine uptake on 10/06/05 was 79% (normal 15-30%). The previously identified "nodule" had avid uptake, similar to the uptake in the remainder of the thyroid gland. The radiologic impression was: "findings compatible with diffuse toxic goiter". Dr. Radene Ou also treated her with atenolol, 50 mg/day.  B. At the time the patient first saw me, she was quite hyperthyroid again. Her past medical history was remarkable only for a unilateral salpingo-oophorectomy for an ectopic pregnancy. Her family history was positive for a paternal grandfather who had been  diagnosed with Graves' disease and exophthalmos and a maternal aunt who had been diagnosed with hypothyroidism.  C. On physical exam, her weight was 125 pounds, her BP was 130/88. Her heart rate was 96. Her eyes were very prominent and she had both inferior proptosis and stare. She had 1+ tongue tremor and 2+ carotid/thyroid bruits. Her thyroid gland was 30-35 grams in size and had a lobulated, firm consistency. She had a grade III/VI systolic flow murmur. She also had 1+ tremor of her outstretched fingers. TSH was.020. Free T4 was 5.0. T3 was 816 (normal 60-181). TPO antibody was 3,873.2 (normal <40-60). TSI was 3.1 (normal < 1.3). Alkaline phosphatase was 179 (normal 39-117), consistent with increased bone turnover due to hyperthyroidism.   D.The patient had a combination of both Graves' Disease with exophthalmos and Hashimoto's Thyroiditis. Her Graves' disease was clearly dominant. Due to her allergic reaction to methimazole, it was quite likely that she might have a similar reaction to propylthiouracil (PTU). I discussed the advantages and disadvantages with her of PTU therapy, I-131 therapy, and thyroidectomy. I recommended I-131 therapy and she concurred. On 01/26/06 she was treated with 12.55 mCi of I-131. Unfortunately, that dose did not destroy enough thyrocytes so she required a second dose of 26.3 mCi of I-131 on 10/12/06.  By 0/18/08 she was hypothyroid, with a TSH of 19.9, free T4 of 0.46, and free T3 of 1.2. I started her on a daily dose of 88 mcg of Synthroid at that time.   2. During the next 2 years the patient's Synthroid dose was progressively increased to 137 mcg/day. However, in 2010 I began reducing her Synthroid dose because she appeared to be having a partial  re-activation of her Graves' Disease. She was often non-compliant with taking her Synthroid. In May 2011 she stopped Synthroid completely because she "felt crazy".   3. At the time of her [facility] visit on 10/05/10, she was taking  Synthroid only sporadically. TSH was 135.19. Free T4 was 0.29. Free T3 was 1.2. TSI was 168 (normal <140). I asked her to take 88 mcg of Synthroid per day.  4. On 12/08/10 she called me. She had had an explosive rage incident at work. She denied taking any illicit drugs, but stated that she was having a lot of trouble sleeping, and so was drinking a cupful of daiquiris several nights per week. TFTs done that day showed a TSH of 108.95, free T4 of 0.56, and free T3 of 1.4. Her TSI was 137. She was still quite hypothyroid, so her rage episode was not caused by her being hyperthyroid. I called her with these results. I made samples of the 88 mcg tablets available to her. I asked her to take her Synthroid daily, to have repeat TFTs done in 2 months, and to keep her appointment on March 1st 2012 as planned. She did not keep that appointment and did not have the labs done. She stated that she did continue to take the 88 mcg Synthroid tablets reliably. She apparently made a follow-up appointment in January 2013, but I was not available. TFTs on 12/06/11 showed a TSH of 2.456, free T4 of 1.49, and free T3 of 2.7. TSI was 82. She did have a FU appointment with me on 02/20/12. I continued her on her Synthroid dose of 88 mcg/day. TFTs on 08/05/12 showed a TSH of 3.072, free T4 1.22, and free T3 3.0. I again continued her on Synthroid, 88 mcg/day. She was subsequently seen in FU in April and October of 2014. Her Synthroid dose was eventually increased back to 100 mcg/day, but has fluctuated since then.     5. The patient's last [facility] visit was on 04/13/17. At that visit I continued her Synthroid dose of 100 mcg/day every day. I also added ranitidine, 150 mg, twice daily. Her belly hunger and reflux have decreased accordingly.   A. In the interim she has been healthy. She is not that tired. She is sleeping "okay", but often wakes up hot and sweaty at night. She drinks " a lot" of tea.    B. She is not pregnant and does not plan  to become pregnant. However, she and her boyfriend are still active sexually and are not using any birth control.     C. Her seasonal allergies have been very active. Her eyes have been itchy and watery.    D. The patient states she still takes the 100 mcg Synthroid tablets daily. She takes her Synthroid about 5 AM daily. She takes her [employer] vitamin D, 50,000 IU, once weekly about 5 PM. She also takes a MVI daily. She continues her ranitidine, 150 mg, twice daily. .    E. She has not been exercising. She is still eating out at restaurants a lot.    6. Review of Systems: She has been fairly healthy.  Constitutional: The patient feels less tired". Energy level is "okay". She has not been jittery. .  Eyes: Eyes are about the same. She can see fairly well without her glasses, but her vision is not as good for distance. She feels that her eyes are less prominent. She has no complaints of ocular pressure with "up and out" gaze in  either eye. There are no significant eye complaints.  Neck: The patient has no complaints of anterior neck swelling, soreness, tenderness,  pressure, discomfort, or difficulty swallowing.  Heart: Heart rate increases with exercise or other physical activity. The patient has no complaints of palpitations, irregular heat beats, chest pain, or chest pressure. Gastrointestinal: She no longer has very much belly hunger, acid indigestion, and reflux. She is still eating more carbs. The patient has no complaints of stomach aches or pains, diarrhea, or constipation. Legs: Muscle mass and strength seem normal. There are no complaints of numbness, tingling, burning, or pain. No edema is noted. Feet: There are no obvious foot problems. There are no complaints of numbness, tingling, burning, or pain. No edema is noted. GYN: LMP was 06/27/17. Periods are regular.    PAST MEDICAL, FAMILY, AND SOCIAL HISTORY:  Past Medical History:  Diagnosis Date  . Fatigue   . Hypothyroid   .  Hypothyroidism following radioiodine therapy   . Menometrorrhagia   . Secondary oligomenorrhea   . Thyroiditis, autoimmune   . Thyrotoxicosis with diffuse goiter   . Toxic diffuse goiter with exophthalmos     Family History  Problem Relation Age of Onset  . Heart disease Father   . Cancer Father        small bowel cancer   . Thyroid disease Maternal Aunt        hypothyroid  . Heart disease Maternal Aunt   . Heart disease Paternal Aunt   . Thyroid disease Paternal Grandfather        hyperthyroid  . Heart disease Paternal Grandfather   . Diabetes Neg Hx      Current Outpatient Prescriptions:  .  loratadine (CLARITIN) 10 MG tablet, Take 10 mg by mouth daily., Disp: , Rfl:  .  ranitidine (ZANTAC) 150 MG tablet, Take 1 tablet (150 mg total) by mouth 2 (two) times daily., Disp: 60 tablet, Rfl: 6 .  SYNTHROID 100 MCG tablet, TAKE 1 TABLET BY MOUTH EVERY DAY, Disp: 30 tablet, Rfl: 2  Allergies as of 07/20/2017 - Review Complete 07/20/2017  Allergen Reaction Noted  . Tapazole [methimazole] Hives 04/20/2011    1. 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 She loves her job. She moved back to [place] and lives with her boyfriend. She still plans to go back to school for her RN degree.  2. Activities: She stopped going to the gym, riding her bike, and walking. .  3. Smoking, alcohol, or drugs: None 4. Primary Care Provider: Dr. Briscoe Deutscher at [facility] at [place], fax [phone] 5. OB-GYN: [facility] on [place]: There are no other significant problems involving Maddilynn's other body systems.   Objective:  Vital Signs:  Wt 161 lb (73 kg)   BMI 30.42 kg/m  Repeat HR 112   Ht Readings from Last 3 Encounters:  02/20/14 5\' 1"  (1.549 m)  10/06/13 5\' 1"  (1.549 m)  07/23/13 5\' 1"  (1.549 m)   Wt Readings from Last 3 Encounters:  07/20/17 161 lb (73 kg)  04/13/17 157 lb (71.2 kg)  01/05/17 155 lb (70.3  kg)   HC Readings from Last 3 Encounters:  No data found for [facility]   Body surface area is 1.77 meters squared.  Facility age limit for growth percentiles is 20 years. Facility age limit for growth percentiles is 20 years.   PHYSICAL EXAM:  Constitutional: The patient appears healthy, happy, but  heavier. She no longer looks tired. Her affect and insight are normal. Her sense of humor is quite good. She has gained 4 pounds in 3 months. Her weight is >130 % of her IBW, indicating that her weight is in the obese zone  Face: The face appears normal. Eyes: Her eyes are still somewhat prominent, with the left eye being slightly more prominent. There is no obvious arcus. She has no inferior proptosis of her eyes today. Extra-ocular movements are normal on both sides. Moisture appears normal. Mouth: The oropharynx and tongue appear normal. Oral moisture is normal. She has a trace mustache. She plucks.  Neck: The neck appears to be visibly normal. No carotid bruits are noted. The thyroid gland is still slightly more enlarged today at about 21 grams in size. The right lobe has shrunk back to normal in size, but the left lobe is slightly more enlarged. The consistency of the thyroid gland is normal. The thyroid gland is not tender to palpation. Lungs: The lungs are clear to auscultation. Air movement is good. Heart: Heart rate and rhythm are regular. Heart sounds S1 and S2 are normal. I did not appreciate any pathologic cardiac murmurs. Abdomen: The abdomen is more enlarged. Bowel sounds are normal. There is no obvious hepatomegaly, splenomegaly, or other mass effect.  Arms: Muscle size and bulk are normal for age. Hands: There is no obvious tremor. Phalangeal and metacarpophalangeal joints are normal. Palmar muscles are normal. Palmar skin is normal. Palmar moisture is also normal. Legs: Muscles appear normal for age. No edema is present. Neurologic: Strength is normal for age in both the upper and lower  extremities. Muscle tone is normal. Sensation to touch is normal in both legs.    LAB DATA:  Labs 07/06/17: TSH 1.35, free T4 1.4, free T3 3.2; iron 101 (ref 40-190), CBC normal   Labs 03/30/17: TSI 114 (ref <140), TSH 0.82, free T4 1.4, free T3 2.6; calcium 9.7, PTH 47, 25-OH vitamin D 57  Labs 01/02/17: TSH 4.83, free T4 1.0, free T3 2.2, TSI 147 (ref <140)  Labs 10/23/16: PTH 83 (ref 14-64), calcium 9.3, 25-OH vitamin D 10 (ref 30-100)  Labs 10/11/16: TSH 0.74, free T4 1.2, free T3 2.7, TSI 147  Labs 07/07/16: TSH 1.08, free T4 1.2, free T3 2.9, TSI < 89  Labs 04/13/16: TSH 3.54, free T4 1.30, free T3 2.4  Labs 12/17/15: TSH 2.35, free T4 1.40, free T3 2.7, TSI 94  Labs 10/04/15: TSH 4.290, free T4 1.15, free T3 2.4, TSI 62 (ref <140)  Labs 03/30/15:TSH 0.382, free T4 1.29, free T3 3.1  Labs 08/19/14: TSH 1.027, free T4 1.07, free T3 2.6  Labs 05/20/14: TSH 0.736, free T4 1.34, free T3 3.1,   Labs 10/20/13; TSH 2.414, free T4 1.2, free T3 2.8  Labs 08/05/13: TSH 8.221, free T4 1.18, free T3 2.5. She may have missed the half-dose last Sunday.  Labs 04/25/13: TSH 0.225, free T4 1.30, free T3 2.8  Labs 02/17/13: TSH 3.069, free T4 1.27, free T3 2.5  Labs 08/06/12: TSH 3.072, free T4 1.22, free T3 2.7  Labs 12/12/11: TSH 2.456, free T4 1.49, Free T3 2.7, FSH 2.9, LH 3.5, TSI 82 (<140%),     Assessment and Plan:   ASSESSMENT:  1-3. Hypothyroid, s/p I-131 therapy for Graves' disease on 3/23/7 and 10/12/06/ Graves' Dz/Hashimoto's thyroiditis    A. Patient's TFTs and TSI levels have continued to fluctuate over time. We have had to adjust her Synthroid doses over  time to compensate for changes in TSI and TFTs levels.   B. At her visit in March 2018, she was mildly hypothyroid, so I increased her Synthroid dose by about 7% to 100 mcg/day every day. She has continued on that dose ever since. She is mid-range euthyroid now.   D. Her TSI in May 2018 was back within normal limits, but still  relatively high. Her labs in May 2018 were normal at about 70% of the true normal thyroid hormone range. .  E. Her Graves Dz is still fighting with her 76 Dz for dominance, with the background of being hypothyroid due to her radioactive iodine therapy. On most days the Hashimoto's disease is her major factor. We are doing our best to keep her Synthroid doses appropriate to her needs 4. Goiter: Her thyroid gland is slightly larger today. This increase in size could be due to Graves' Dz, to [place], or to a combination of both diseases.  5. Exophthalmos: Her left eye is less prominent today, but still somewhat more prominent than the right eye. There is no eye proptosis, lid lag, or restriction of extra-ocular muscle movements in either eye today. Her exophthalmos parallels her TSI activity.   6. Oligomenorrhea and menometrorrhagia: Resolved 7. Overweight/obesity: She needs more exercise and fewer carbs.  8-9. Vitamin D deficiency/hyperparathyroidism: Her December 2017 labs showed that she was hyperparathyroid secondary to vitamin D deficiency disease. Since starting [employer], however, her lab tests in May 2018 showed that her vitamin D level had normalized and her PTH level had normalized. I would like her to alto take a good quality MVI per day such as Centrum for Women or One-A -Day for Women.  10. Dyspepsia/reflux: These problems have significantly improved after taking ranitidine.  11. Fatigue, other. She is doing much better today. She does not have any anemia or iron deficiency.  PLAN:  1. Diagnostic: TFTs, CMP, 25-OH vitamin D, calcium, PTH in 4 months.  2. Therapeutic: Continue the Synthroid dose of 100 mcg/day every day. Continue [employer]. Continue a good MVI daily. Continue ranitidine, 150 mg, twice daily.  Eat Right and exercise daily. 3. Patient education: We discussed issues of Graves' Dz, Hashimoto's Dz, exophthalmos, and the need to adjust her Synthroid doses accordingly. We  also discussed her obesity and the need to eat right and to exercise. We also discussed that if she were to become pregnant again, she would need more Synthroid during the pregnancy.   4. Follow-up: 4 months, or every 2 months if she becomes pregnant again. Call me if she becomes pregnant again.   Level of Service: This visit lasted in excess of 50 minutes. More than 50% of the visit was devoted to counseling.  Tillman Sers, MD, CDE Adult and Pediatric Endocrinology 07/20/2017 8:39 AM

## 2017-07-20 NOTE — Patient Instructions (Signed)
Follow up visit in 4 months. Please repeat lab tests 1-2 weeks prior. Call me if you become pregnant.

## 2017-08-15 ENCOUNTER — Other Ambulatory Visit (INDEPENDENT_AMBULATORY_CARE_PROVIDER_SITE_OTHER): Payer: Self-pay | Admitting: "Endocrinology

## 2017-08-15 DIAGNOSIS — E890 Postprocedural hypothyroidism: Secondary | ICD-10-CM

## 2017-10-12 ENCOUNTER — Telehealth (INDEPENDENT_AMBULATORY_CARE_PROVIDER_SITE_OTHER): Payer: Self-pay | Admitting: "Endocrinology

## 2017-10-12 NOTE — Telephone Encounter (Signed)
TC to Jessica Maxwell, she said that is too long the wait until the next appointment and is having some symptoms with her thyroid. Advised that she can go ahead get the labs done, and Dr. Tobe Sos can result them before the appointment. Patient ok with info.

## 2017-10-12 NOTE — Telephone Encounter (Signed)
°  Who's calling (name and relationship to patient) : self  Best contact number: [phone]   Provider they see: Dr Tobe Sos  Reason for call: Pt needs a call back regarding labs prior to next appt on 12/21/17,  appt sched for 39mo's after last visit and pt would like to know if she can have them done sooner then right before her next appt 12/21/17.

## 2017-11-07 ENCOUNTER — Telehealth (INDEPENDENT_AMBULATORY_CARE_PROVIDER_SITE_OTHER): Payer: Self-pay | Admitting: "Endocrinology

## 2017-11-07 NOTE — Telephone Encounter (Signed)
°  Who's calling (name and relationship to patient) : Alethea (Self) Best contact number: [phone] Provider they see: Dr. Tobe Sos Reason for call: Pt wanted to know if it was okay for her to have her wisdom teeth removed.

## 2017-11-08 NOTE — Telephone Encounter (Signed)
LVM, Advised that per Dr. Tobe Sos there are no issues in having wisdom teeth removed.

## 2017-11-21 ENCOUNTER — Other Ambulatory Visit (INDEPENDENT_AMBULATORY_CARE_PROVIDER_SITE_OTHER): Payer: Self-pay | Admitting: "Endocrinology

## 2017-11-21 DIAGNOSIS — E890 Postprocedural hypothyroidism: Secondary | ICD-10-CM

## 2017-11-23 ENCOUNTER — Ambulatory Visit (INDEPENDENT_AMBULATORY_CARE_PROVIDER_SITE_OTHER): Payer: BLUE CROSS/BLUE SHIELD | Admitting: "Endocrinology

## 2017-12-13 ENCOUNTER — Telehealth (INDEPENDENT_AMBULATORY_CARE_PROVIDER_SITE_OTHER): Payer: Self-pay | Admitting: "Endocrinology

## 2017-12-13 NOTE — Telephone Encounter (Signed)
Spoke to patient. Advised labs already in [facility]

## 2017-12-13 NOTE — Telephone Encounter (Signed)
°  Who's calling (name and relationship to patient) : Sheyna (Mother) Best contact number: [phone] Provider they see: Dr. Tobe Sos Reason for call: Pt would like to have bloodwork done. She wanted to know if orders could be put in Epic so that she can get her blood drawn (She works for [employer]). She also wanted Dr. Tobe Sos to know that she did have her wisdom tooth removed, which he cleared her for the surgery, but she doesn't feel well and thinks that the anesthesia may have affected her Synthroid.

## 2017-12-14 DIAGNOSIS — E211 Secondary hyperparathyroidism, not elsewhere classified: Secondary | ICD-10-CM | POA: Diagnosis not present

## 2017-12-14 DIAGNOSIS — E890 Postprocedural hypothyroidism: Secondary | ICD-10-CM | POA: Diagnosis not present

## 2017-12-14 DIAGNOSIS — E559 Vitamin D deficiency, unspecified: Secondary | ICD-10-CM | POA: Diagnosis not present

## 2017-12-17 LAB — T4, FREE: FREE T4: 1.4 ng/dL (ref 0.8–1.8)

## 2017-12-17 LAB — COMPREHENSIVE METABOLIC PANEL
AG Ratio: 1.4 (calc) (ref 1.0–2.5)
ALKALINE PHOSPHATASE (APISO): 80 U/L (ref 33–115)
ALT: 17 U/L (ref 6–29)
AST: 19 U/L (ref 10–30)
Albumin: 3.7 g/dL (ref 3.6–5.1)
BILIRUBIN TOTAL: 0.2 mg/dL (ref 0.2–1.2)
BUN: 11 mg/dL (ref 7–25)
CO2: 27 mmol/L (ref 20–32)
Calcium: 8.9 mg/dL (ref 8.6–10.2)
Chloride: 104 mmol/L (ref 98–110)
Creat: 0.69 mg/dL (ref 0.50–1.10)
Globulin: 2.7 g/dL (calc) (ref 1.9–3.7)
Glucose, Bld: 81 mg/dL (ref 65–99)
Potassium: 4.4 mmol/L (ref 3.5–5.3)
Sodium: 137 mmol/L (ref 135–146)
Total Protein: 6.4 g/dL (ref 6.1–8.1)

## 2017-12-17 LAB — TSH: TSH: 1.75 mIU/L

## 2017-12-17 LAB — VITAMIN D 25 HYDROXY (VIT D DEFICIENCY, FRACTURES): Vit D, 25-Hydroxy: 70 ng/mL (ref 30–100)

## 2017-12-17 LAB — PTH, INTACT AND CALCIUM
CALCIUM: 8.9 mg/dL (ref 8.6–10.2)
PTH: 66 pg/mL — ABNORMAL HIGH (ref 14–64)

## 2017-12-17 LAB — T3, FREE: T3, Free: 2.7 pg/mL (ref 2.3–4.2)

## 2017-12-21 ENCOUNTER — Ambulatory Visit (INDEPENDENT_AMBULATORY_CARE_PROVIDER_SITE_OTHER): Payer: BLUE CROSS/BLUE SHIELD | Admitting: "Endocrinology

## 2017-12-21 ENCOUNTER — Encounter (INDEPENDENT_AMBULATORY_CARE_PROVIDER_SITE_OTHER): Payer: Self-pay | Admitting: "Endocrinology

## 2017-12-21 VITALS — BP 120/78 | HR 88 | Ht 62.05 in | Wt 162.0 lb

## 2017-12-21 DIAGNOSIS — E559 Vitamin D deficiency, unspecified: Secondary | ICD-10-CM | POA: Diagnosis not present

## 2017-12-21 DIAGNOSIS — R5383 Other fatigue: Secondary | ICD-10-CM

## 2017-12-21 DIAGNOSIS — E063 Autoimmune thyroiditis: Secondary | ICD-10-CM

## 2017-12-21 DIAGNOSIS — E211 Secondary hyperparathyroidism, not elsewhere classified: Secondary | ICD-10-CM | POA: Diagnosis not present

## 2017-12-21 DIAGNOSIS — E890 Postprocedural hypothyroidism: Secondary | ICD-10-CM

## 2017-12-21 DIAGNOSIS — E349 Endocrine disorder, unspecified: Secondary | ICD-10-CM | POA: Diagnosis not present

## 2017-12-21 DIAGNOSIS — I10 Essential (primary) hypertension: Secondary | ICD-10-CM | POA: Diagnosis not present

## 2017-12-21 DIAGNOSIS — E0500 Thyrotoxicosis with diffuse goiter without thyrotoxic crisis or storm: Secondary | ICD-10-CM | POA: Diagnosis not present

## 2017-12-21 NOTE — Progress Notes (Signed)
Subjective:  Patient Name: Jessica Maxwell Date of Birth: 1976-02-05  MRN: [id number]  Jessica Maxwell  presents to the office today for follow-up of her hypothyroidism s/p I-131 therapy for Graves' disease, exophthalmos, goiter, Hashimoto's thyroiditis, weight loss, fatigue, oligomenorrhea, menometrorrhagia, low libido, hirsutism.  HISTORY OF PRESENT ILLNESS:   Jessica Maxwell is a 42 y.o. African-American woman.  Jessica Maxwell was unaccompanied.  1. The patient was referred to me on 11/07/2005 by Dr. Milagros Evener of the [facility] for evaluation and management of  thyrotoxicosis caused by Graves' disease, exophthalmos, weight loss, tachycardia, and tremor. The patient was then 42 years old.  A. The patient had begun to develop symptoms and signs of thyrotoxicosis at age 65-19. By the Summer of  2006 she had begun to miss menstrual periods. She saw Dr. Radene Ou in October 2006. Dr. Radene Ou diagnosed Graves disease clinically and ordered lab tests. On 08/28/05 the TSH was 0.022. Free T4 was 6.28. Dr. Radene Ou started the patient on methimazole, which reduced the free T4 to 1.6 by 09/19/05, but the patient developed an extensive rash on day 21 of therapy, so Dr. Radene Ou appropriately discontinued the medication on 09/22/05.  Thyroid US on 09/04/05 showed a 10x16 mm nodule in the right superior pole. Echotexture in both lobes was inhomogeneous. Radioactive iodine uptake on 10/06/05 was 79% (normal 15-30%). The previously identified "nodule" had avid uptake, similar to the uptake in the remainder of the thyroid gland. The radiologic impression was: "findings compatible with diffuse toxic goiter". Dr. Radene Ou also treated her with atenolol, 50 mg/day.  B. At the time the patient first saw me, she was quite hyperthyroid again. Her past medical history was remarkable only for a unilateral salpingo-oophorectomy for an ectopic pregnancy. Her family history was positive for a paternal grandfather who had been  diagnosed with Graves' disease and exophthalmos and a maternal aunt who had been diagnosed with hypothyroidism.  C. On physical exam, her weight was 125 pounds, her BP was 130/88. Her heart rate was 96. Her eyes were very prominent and she had both inferior proptosis and stare. She had 1+ tongue tremor and 2+ carotid/thyroid bruits. Her thyroid gland was 30-35 grams in size and had a lobulated, firm consistency. She had a grade III/VI systolic flow murmur. She also had 1+ tremor of her outstretched fingers. TSH was.020. Free T4 was 5.0. T3 was 816 (normal 60-181). TPO antibody was 3,873.2 (normal <40-60). TSI was 3.1 (normal < 1.3). Alkaline phosphatase was 179 (normal 39-117), consistent with increased bone turnover due to hyperthyroidism.   D.The patient had a combination of both Graves' Disease with exophthalmos and Hashimoto's Thyroiditis. Her Graves' disease was clearly dominant. Due to her allergic reaction to methimazole, it was quite likely that she might have a similar reaction to propylthiouracil (PTU). I discussed the advantages and disadvantages with her of PTU therapy, I-131 therapy, and thyroidectomy. I recommended I-131 therapy and she concurred. On 01/26/06 she was treated with 12.55 mCi of I-131. Unfortunately, that dose did not destroy enough thyrocytes so she required a second dose of 26.3 mCi of I-131 on 10/12/06.  By 0/18/08 she was hypothyroid, with a TSH of 19.9, free T4 of 0.46, and free T3 of 1.2. I started her on a daily dose of 88 mcg of Synthroid at that time.   2. During the next 2 years the patient's Synthroid dose was progressively increased to 137 mcg/day. However, in 2010 I began reducing her Synthroid dose because she appeared to be having a partial  re-activation of her Graves' Disease. She was often non-compliant with taking her Synthroid. In May 2011 she stopped Synthroid completely because she "felt crazy".   3. At the time of her [facility] visit on 10/05/10, she was taking  Synthroid only sporadically. TSH was 135.19. Free T4 was 0.29. Free T3 was 1.2. TSI was 168 (normal <140). I asked her to take 88 mcg of Synthroid per day.  4. On 12/08/10 she called me. She had had an explosive rage incident at work. She denied taking any illicit drugs, but stated that she was having a lot of trouble sleeping, and so was drinking a cupful of daiquiris several nights per week. TFTs done that day showed a TSH of 108.95, free T4 of 0.56, and free T3 of 1.4. Her TSI was 137. She was still quite hypothyroid, so her rage episode was not caused by her being hyperthyroid. I called her with these results. I made samples of the 88 mcg tablets available to her. I asked her to take her Synthroid daily, to have repeat TFTs done in 2 months, and to keep her appointment on March 1st 2012 as planned. She did not keep that appointment and did not have the labs done. She stated that she did continue to take the 88 mcg Synthroid tablets reliably. She apparently made a follow-up appointment in January 2013, but I was not available. TFTs on 12/06/11 showed a TSH of 2.456, free T4 of 1.49, and free T3 of 2.7. TSI was 82. She did have a FU appointment with me on 02/20/12. I continued her on her Synthroid dose of 88 mcg/day. TFTs on 08/05/12 showed a TSH of 3.072, free T4 1.22, and free T3 3.0. I again continued her on Synthroid, 88 mcg/day. She was subsequently seen in FU in April and October of 2014. Her Synthroid dose was eventually increased back to 100 mcg/day, but has fluctuated since then.     5. The patient's last [facility] visit was on 07/20/17. At that visit I continued her Synthroid dose of 100 mcg/day every day and her ranitidine, 150 mg, twice daily. She has since changed the ranitidine to two pills each evening to control her reflux. .   A. In the interim she has been healthy. On 12/08/17 she had a wisdom tooth removed, was treated with antibiotics, and given Decadron for three days. She felt "crazy" and stopped  the antibiotic after 5 days.   B. She felt fine prior to taking Decadron and feels fine today. She is not tired. She is sleeping "okay", but often wakes up hot and sweaty at night. She still drinks "a lot" of tea during the day.    C. She is not pregnant and does not plan to become pregnant. However, she and her boyfriend are still active sexually and are not using any birth control.     D. Her seasonal allergies have been very active. Her eyes have been itchy and watery.    E. The patient states she still takes the 100 mcg Synthroid tablets daily. She takes her Synthroid about 5 AM daily. She takes her [employer] vitamin D, 50,000 IU, once weekly about 5 PM. She also takes One-A-Day for Women at bedtime daily. She continues her ranitidine, 300 mg at dinner.    F. She has recently resumed going to the gym three times per week. She is still eating out at restaurants a lot, but not quite as much.   6. Review of Systems: She has been  fairly healthy.  Constitutional: The patient feels "good". Energy level is "good". She has not been jittery.  Eyes: Eyes are about the same. She can see fairly well without her glasses, but her vision is not as good for distance. She feels that her left eye is still more prominent than the right eye. She has no complaints of ocular pressure with "up and out" gaze in either eye. There are no significant eye complaints.  Neck: The patient has no complaints of anterior neck swelling, soreness, tenderness,  pressure, discomfort, or difficulty swallowing.  Heart: Heart rate increases with exercise or other physical activity. The patient has no complaints of palpitations, irregular heat beats, chest pain, or chest pressure. Gastrointestinal: She has more belly hunger during the day, but less acid indigestion and reflux. She has reduced her carb intake in the past two weeks. The patient has no complaints of stomach aches or pains, diarrhea, or constipation. Legs: Muscle mass and  strength seem normal. There are no complaints of numbness, tingling, burning, or pain. No edema is noted. Feet: There are no obvious foot problems. There are no complaints of numbness, tingling, burning, or pain. No edema is noted. GYN: LMP was 11/29/17. Periods are regular.    PAST MEDICAL, FAMILY, AND SOCIAL HISTORY:  Past Medical History:  Diagnosis Date  . Fatigue   . Hypothyroid   . Hypothyroidism following radioiodine therapy   . Menometrorrhagia   . Secondary oligomenorrhea   . Thyroiditis, autoimmune   . Thyrotoxicosis with diffuse goiter   . Toxic diffuse goiter with exophthalmos     Family History  Problem Relation Age of Onset  . Heart disease Father   . Cancer Father        small bowel cancer   . Thyroid disease Maternal Aunt        hypothyroid  . Heart disease Maternal Aunt   . Heart disease Paternal Aunt   . Thyroid disease Paternal Grandfather        hyperthyroid  . Heart disease Paternal Grandfather   . Diabetes Neg Hx      Current Outpatient Medications:  .  loratadine (CLARITIN) 10 MG tablet, Take 10 mg by mouth daily., Disp: , Rfl:  .  ranitidine (ZANTAC) 150 MG tablet, Take 1 tablet (150 mg total) by mouth 2 (two) times daily., Disp: 60 tablet, Rfl: 6 .  SYNTHROID 100 MCG tablet, TAKE 1 TABLET BY MOUTH EVERY DAY, Disp: 30 tablet, Rfl: 5 .  Vitamin D, Ergocalciferol, (DRISDOL) 50000 units CAPS capsule, Take 50,000 Units by mouth every 7 (seven) days., Disp: , Rfl:   Allergies as of 12/21/2017 - Review Complete 12/21/2017  Allergen Reaction Noted  . Tapazole [methimazole] Hives 04/20/2011    1. 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 She loves her job. She moved back to [place] and lives with her boyfriend. She still plans to go back to school for her RN degree.  2. Activities: She is going to the gym, but not riding her bike or walking.  3. Smoking, alcohol, or drugs: None 4. Primary Care Provider: Dr. Briscoe Deutscher at  [facility] [place], fax [phone] 5. OB-GYN: [facility] on [place]: There are no other significant problems involving Jessica Maxwell's other body systems.   Objective:  Vital Signs:  BP 120/78   Pulse 88   Ht 5' 2.05" (1.576 m)   Wt 162 lb (73.5 kg)  BMI 29.59 kg/m  Repeat HR 112   Ht Readings from Last 3 Encounters:  12/21/17 5' 2.05" (1.576 m)  02/20/14 5\' 1"  (1.549 m)  10/06/13 5\' 1"  (1.549 m)   Wt Readings from Last 3 Encounters:  12/21/17 162 lb (73.5 kg)  07/20/17 161 lb (73 kg)  04/13/17 157 lb (71.2 kg)   HC Readings from Last 3 Encounters:  No data found for [facility]   Body surface area is 1.79 meters squared.  Facility age limit for growth percentiles is 20 years. Facility age limit for growth percentiles is 20 years.   PHYSICAL EXAM:  Constitutional: The patient appears healthy, happy, but still overweight/obese. She no longer looks tired. Her affect and insight are normal. Her sense of humor is quite good. She has gained 1 pound in 5 months. Her weight is >130 % of her IBW, indicating that her weight is in the obese zone  Face: The face appears normal. Eyes: Her eyes are still somewhat prominent, with the left eye being slightly more prominent, but neither eye appears to be more prominent than they were at her last visit. There is no obvious arcus. She has no inferior proptosis of her eyes today. Extra-ocular movements are normal on both sides. Moisture appears normal. Mouth: The oropharynx and tongue appear normal. Oral moisture is normal. She has a trace mustache. She plucks.  Neck: The neck appears to be visibly normal. No carotid bruits are noted. The thyroid gland is still slightly enlarged today at about 21 grams in size. The right lobe has shrunk back to normal in size, but the left lobe is slightly enlarged. The consistency of the thyroid gland is normal. The thyroid gland is not tender to palpation. Lungs: The  lungs are clear to auscultation. Air movement is good. Heart: Heart rate and rhythm are regular. Heart sounds S1 and S2 are normal. I did not appreciate any pathologic cardiac murmurs. Abdomen: The abdomen is still enlarged. Bowel sounds are normal. There is no obvious hepatomegaly, splenomegaly, or other mass effect.  Arms: Muscle size and bulk are normal for age. Hands: There is no obvious tremor. Phalangeal and metacarpophalangeal joints are normal. Palmar muscles are normal. Palmar skin is normal. Palmar moisture is also normal. Legs: Muscles appear normal for age. No edema is present. Neurologic: Strength is normal for age in both the upper and lower extremities. Muscle tone is normal. Sensation to touch is normal in both legs.    LAB DATA:  Labs 12/15/17: TSH 1.75, free T4 1.4, free T3 2.7; CMP normal; 25-OH vitamin D 70, calcium 8.9, PTH 66 (ref 14-64)  Labs 07/06/17: TSH 1.35, free T4 1.4, free T3 3.2; iron 101 (ref 40-190), CBC normal   Labs 03/30/17: TSI 114 (ref <140), TSH 0.82, free T4 1.4, free T3 2.6; calcium 9.7, PTH 47, 25-OH vitamin D 57  Labs 01/02/17: TSH 4.83, free T4 1.0, free T3 2.2, TSI 147 (ref <140)  Labs 10/23/16: PTH 83 (ref 14-64), calcium 9.3, 25-OH vitamin D 10 (ref 30-100)  Labs 10/11/16: TSH 0.74, free T4 1.2, free T3 2.7, TSI 147  Labs 07/07/16: TSH 1.08, free T4 1.2, free T3 2.9, TSI < 89  Labs 04/13/16: TSH 3.54, free T4 1.30, free T3 2.4  Labs 12/17/15: TSH 2.35, free T4 1.40, free T3 2.7, TSI 94  Labs 10/04/15: TSH 4.290, free T4 1.15, free T3 2.4, TSI 62 (ref <140)  Labs 03/30/15:TSH 0.382, free T4 1.29, free T3 3.1  Labs 08/19/14: TSH  1.027, free T4 1.07, free T3 2.6  Labs 05/20/14: TSH 0.736, free T4 1.34, free T3 3.1,   Labs 10/20/13; TSH 2.414, free T4 1.2, free T3 2.8  Labs 08/05/13: TSH 8.221, free T4 1.18, free T3 2.5. She may have missed the half-dose last Sunday.  Labs 04/25/13: TSH 0.225, free T4 1.30, free T3 2.8  Labs 02/17/13: TSH  3.069, free T4 1.27, free T3 2.5  Labs 08/06/12: TSH 3.072, free T4 1.22, free T3 2.7  Labs 12/12/11: TSH 2.456, free T4 1.49, Free T3 2.7, FSH 2.9, LH 3.5, TSI 82 (<140%),     Assessment and Plan:   ASSESSMENT:  1-3. Hypothyroid, s/p I-131 therapy for Graves' disease on 3/23/7 and 10/12/06/ Graves' Dz/Hashimoto's thyroiditis    A. Patient's TFTs and TSI levels have continued to fluctuate over time. We have had to adjust her Synthroid doses over time to compensate for changes in TSI and TFT levels.   B. At her visit in March 2018, she was mildly hypothyroid, so I increased her Synthroid dose by about 7% to 100 mcg/day every day. She has continued on that dose ever since. She is mid-range euthyroid now.   D. Her TSI in May 2018 was back within normal limits, but still relatively high. Her TFTs in May 2018 were normal at about 70% of the true normal thyroid hormone range.   E. Her TFTs in February 2019 were normal at about 45% of the normal range.   F. Her Graves Dz is still fighting with her Hashimoto's Dz for dominance, with the background of being hypothyroid due to her radioactive iodine therapy. On most days the Hashimoto's disease is her major factor. We are doing our best to keep her Synthroid doses appropriate to her needs 4. Goiter: Her thyroid gland is slightly enlarged today, but unchanged since her last visit. Her thyroiditis appears to be clinically quiescent.  5. Exophthalmos: Her left eye is still somewhat more prominent than the right eye, but essentially unchanged since her last visit. There is no eye proptosis, lid lag, or restriction of extra-ocular muscle movements in either eye today. Her exophthalmos parallels her TSI activity.   6. Oligomenorrhea and menometrorrhagia: Resolved 7. Overweight/obesity: She needs more exercise and fewer carbs.  8-9. Vitamin D deficiency/hyperparathyroidism/relative hypocalcemia:   A. Her December 2017 labs showed that she was hyperparathyroid  secondary to vitamin D deficiency disease. Since starting [employer], however, her lab tests in May 2018 showed that her vitamin D level had normalized and her PTH level had normalized.   B. Her February labs show that her vitamin D is in the middle of the normal range, but her PTH is now slightly elevated and her calcium is still low normal, despite the effect of the increased PTH to cause extraction of calcium from her bone stores. These labs show that she is not taking in enough calcium, so she has secondary hyperparathyroidism, secondary now to inadequate calcium intake. . She needs at least one good calcium pill per day. The Citracal form of calcium citrate is preferred. I would like her to continue the One-A-Day for Women.  10. Dyspepsia/reflux: These problems had significantly improved after taking ranitidine, but then worsened when she increased her carb intake and decreased her activity level.  11. Fatigue, other. She is doing much better today. She does not have any anemia or iron deficiency.  PLAN:  1. Diagnostic: TFTs, CMP, 25-OH vitamin D, calcium, PTH in 4 months.  2. Therapeutic: Continue the Synthroid  dose of 100 mcg/day every day. Continue [employer]. Continue One-A-Day for women daily. Continue ranitidine, 300 mg at dinner. Add one Citracal 500-600 mg tablet at dinner. Eat Right and exercise daily. 3. Patient education: We discussed issues of Graves' Dz, Hashimoto's Dz, exophthalmos, and the need to adjust her Synthroid doses accordingly. We also discussed her obesity and the need to eat right and to exercise. We discussed vitamin D intake, calcium intake, and secondary hyperparathyroidism. We also discussed that if she were to become pregnant again, she would need more Synthroid during the pregnancy.   4. Follow-up: 4 months, or every 2 months if she becomes pregnant again. Call me if she becomes pregnant again.   Level of Service: This visit lasted in excess of 50 minutes. More than 50% of  the visit was devoted to counseling.  Tillman Sers, MD, CDE Adult and Pediatric Endocrinology 12/21/2017 8:57 AM

## 2017-12-21 NOTE — Patient Instructions (Signed)
Follow up visit in 4 months. Please add one Citracal tablet either 500 or 600 mg, once daily at dinner. Please repeat lab tests 1-2 weeks prior to next visit.

## 2018-04-22 LAB — PTH, INTACT AND CALCIUM
CALCIUM: 9.6 mg/dL (ref 8.6–10.2)
PTH: 29 pg/mL (ref 14–64)

## 2018-04-22 LAB — T3, FREE: T3, Free: 2.6 pg/mL (ref 2.3–4.2)

## 2018-04-22 LAB — TSH: TSH: 1.06 mIU/L

## 2018-04-22 LAB — VITAMIN D 25 HYDROXY (VIT D DEFICIENCY, FRACTURES): VIT D 25 HYDROXY: 71 ng/mL (ref 30–100)

## 2018-04-22 LAB — T4, FREE: Free T4: 1.2 ng/dL (ref 0.8–1.8)

## 2018-04-26 ENCOUNTER — Encounter (INDEPENDENT_AMBULATORY_CARE_PROVIDER_SITE_OTHER): Payer: Self-pay | Admitting: "Endocrinology

## 2018-04-26 ENCOUNTER — Ambulatory Visit (INDEPENDENT_AMBULATORY_CARE_PROVIDER_SITE_OTHER): Payer: BLUE CROSS/BLUE SHIELD | Admitting: "Endocrinology

## 2018-04-26 VITALS — BP 112/60 | HR 76 | Ht 62.6 in | Wt 163.0 lb

## 2018-04-26 DIAGNOSIS — R5383 Other fatigue: Secondary | ICD-10-CM

## 2018-04-26 DIAGNOSIS — E0500 Thyrotoxicosis with diffuse goiter without thyrotoxic crisis or storm: Secondary | ICD-10-CM

## 2018-04-26 DIAGNOSIS — E890 Postprocedural hypothyroidism: Secondary | ICD-10-CM | POA: Diagnosis not present

## 2018-04-26 DIAGNOSIS — E349 Endocrine disorder, unspecified: Secondary | ICD-10-CM | POA: Diagnosis not present

## 2018-04-26 DIAGNOSIS — E6609 Other obesity due to excess calories: Secondary | ICD-10-CM | POA: Diagnosis not present

## 2018-04-26 DIAGNOSIS — E211 Secondary hyperparathyroidism, not elsewhere classified: Secondary | ICD-10-CM

## 2018-04-26 DIAGNOSIS — E559 Vitamin D deficiency, unspecified: Secondary | ICD-10-CM | POA: Diagnosis not present

## 2018-04-26 NOTE — Patient Instructions (Signed)
Follow up visit in 6 months. Please repeat lab tests about two weeks prior.

## 2018-04-26 NOTE — Progress Notes (Signed)
Subjective:  Patient Name: Jessica Maxwell Date of Birth: August 27, 1976  MRN: [id number]  Jessica Maxwell  presents to the office today for follow-up of her hypothyroidism s/p I-131 therapy for Graves' disease, exophthalmos, goiter, Hashimoto's thyroiditis, weight loss, fatigue, oligomenorrhea, menometrorrhagia, low libido, hirsutism, hypocalcemia, vitamin d deficiency, and secondary hyperparathyroidism.  HISTORY OF PRESENT ILLNESS:   Jessica Maxwell is a 42 y.o. African-American woman.  Jessica Maxwell was unaccompanied.  1. The patient was referred to me on 11/07/2005 by Dr. Milagros Evener of the [facility] for evaluation and management of  thyrotoxicosis caused by Graves' disease, exophthalmos, weight loss, tachycardia, and tremor. The patient was then 42 years old.  A. The patient had begun to develop symptoms and signs of thyrotoxicosis at age 74-19. By the Summer of  2006 she had begun to miss menstrual periods. She saw Dr. Radene Ou in October 2006. Dr. Radene Ou diagnosed Graves disease clinically and ordered lab tests. On 08/28/05 the TSH was 0.022. Free T4 was 6.28. Dr. Radene Ou started the patient on methimazole, which reduced the free T4 to 1.6 by 09/19/05, but the patient developed an extensive rash on day 21 of therapy, so Dr. Radene Ou appropriately discontinued the medication on 09/22/05.  Thyroid US on 09/04/05 showed a 10x16 mm nodule in the right superior pole. Echotexture in both lobes was inhomogeneous. Radioactive iodine uptake on 10/06/05 was 79% (normal 15-30%). The previously identified "nodule" had avid uptake, similar to the uptake in the remainder of the thyroid gland. The radiologic impression was: "findings compatible with diffuse toxic goiter". Dr. Radene Ou also treated her with atenolol, 50 mg/day.  B. At the time the patient first saw me, she was quite hyperthyroid again. Her past medical history was remarkable only for a unilateral salpingo-oophorectomy for an ectopic pregnancy. Her  family history was positive for a paternal grandfather who had been diagnosed with Graves' disease and exophthalmos and a maternal aunt who had been diagnosed with hypothyroidism.  C. On physical exam, her weight was 125 pounds, her BP was 130/88. Her heart rate was 96. Her eyes were very prominent and she had both inferior proptosis and stare. She had 1+ tongue tremor and 2+ carotid/thyroid bruits. Her thyroid gland was 30-35 grams in size and had a lobulated, firm consistency. She had a grade III/VI systolic flow murmur. She also had 1+ tremor of her outstretched fingers. TSH was.020. Free T4 was 5.0. T3 was 816 (normal 60-181). TPO antibody was 3,873.2 (normal <40-60). TSI was 3.1 (normal < 1.3). Alkaline phosphatase was 179 (normal 39-117), consistent with increased bone turnover due to hyperthyroidism.   D.The patient had a combination of both Graves' Disease with exophthalmos and Hashimoto's Thyroiditis. Her Graves' disease was clearly dominant. Due to her allergic reaction to methimazole, it was quite likely that she might have a similar reaction to propylthiouracil (PTU). I discussed the advantages and disadvantages with her of PTU therapy, I-131 therapy, and thyroidectomy. I recommended I-131 therapy and she concurred. On 01/26/06 she was treated with 12.55 mCi of I-131. Unfortunately, that dose did not destroy enough thyrocytes so she required a second dose of 26.3 mCi of I-131 on 10/12/06.  By 0/18/08 she was hypothyroid, with a TSH of 19.9, free T4 of 0.46, and free T3 of 1.2. I started her on a daily dose of 88 mcg of Synthroid at that time.   2. During the last 11 years we have worked with Jessica Maxwell about several issues. For a detailed discussion of these issues see my encounter note from  12/21/17:   A. Jessica Maxwell's thyroid hormone levels have fluctuated based upon her Graves disease activity, her Hashimoto's disease activity, her compliance with taking Synthroid, and her absorption of the Synthroid. As a result  we have adjusted her Synthroid doses many time.  B. Her Graves' disease exophthalmos has improved over time, but persists. When her allergies act up she has more conjunctival erythema and irritation than a person without exophthalmos would have.   C. She has had problems several times with secondary hyperparathyroidism due to vitamin D deficiency and hypocalcemia due to inadequate calcium intake.   D. She has gradually gained more weight over time and become mildly obese.   5. The patient's last [facility] visit was on 12/21/17. At that visit I continued her Synthroid dose of 100 mcg/day every day and her ranitidine dosage of two 150 mg pills at dinner each evening to control her reflux. I also continued her Biotech once weekly. I started her on Citracal daily, but she reduced the medication to about twice a week due to constipation.   A. In the interim she has been healthy. She has been having more allergic conjunctivitis, conjunctival redness, and nasal congestion.   B. She feels good otherwise. She is not tired. She is sleeping "okay", but occasionally wakes up hot and sweaty at night. She still drinks "a lot" of tea during the day.    C. She is not pregnant and does not plan to become pregnant. However, she and her boyfriend are still active sexually and are not using any birth control.     D. The patient states she still takes the 100 mcg Synthroid tablets daily. She takes her Synthroid about 5 AM daily. She takes her Biotech vitamin D, 50,000 IU, once weekly about 5 PM. She also takes One-A-Day for Women at bedtime daily. She continues her ranitidine, 300 mg at dinner.    E. She is still going to the gym three times per week. She is still eating out at restaurants a lot.   6. Review of Systems: She has been fairly healthy.  Constitutional: The patient feels "good". Energy level is "good". She has not been jittery.  Eyes: Eyes are about the same. She can see fairly well without her glasses, but her  vision is not as good for distance. She feels that her left eye is still more prominent than the right eye. She has no complaints of ocular pressure with "up and out" gaze in either eye. There are no significant eye complaints.  Neck: The patient has no complaints of anterior neck swelling, soreness, tenderness,  pressure, discomfort, or difficulty swallowing.  Heart: Heart rate increases with exercise or other physical activity. The patient has no complaints of palpitations, irregular heat beats, chest pain, or chest pressure. Gastrointestinal: She has more belly hunger during the day, but less acid indigestion and reflux. She has reduced her carb intake in the past two weeks. The patient has no complaints of stomach aches or pains, diarrhea, or constipation since reducing the Citracal. Legs: Muscle mass and strength seem normal. There are no complaints of numbness, tingling, burning, or pain. No edema is noted. Feet: There are no obvious foot problems. There are no complaints of numbness, tingling, burning, or pain. No edema is noted. GYN: LMP was 04/10/18. Periods are regular.    PAST MEDICAL, FAMILY, AND SOCIAL HISTORY:  Past Medical History:  Diagnosis Date  . Fatigue   . Hypothyroid   . Hypothyroidism following radioiodine therapy   .  Menometrorrhagia   . Secondary oligomenorrhea   . Thyroiditis, autoimmune   . Thyrotoxicosis with diffuse goiter   . Toxic diffuse goiter with exophthalmos     Family History  Problem Relation Age of Onset  . Heart disease Father   . Cancer Father        small bowel cancer   . Thyroid disease Maternal Aunt        hypothyroid  . Heart disease Maternal Aunt   . Heart disease Paternal Aunt   . Thyroid disease Paternal Grandfather        hyperthyroid  . Heart disease Paternal Grandfather   . Diabetes Neg Hx      Current Outpatient Medications:  .  ranitidine (ZANTAC) 150 MG tablet, Take 1 tablet (150 mg total) by mouth 2 (two) times daily., Disp:  60 tablet, Rfl: 6 .  SYNTHROID 100 MCG tablet, TAKE 1 TABLET BY MOUTH EVERY DAY, Disp: 30 tablet, Rfl: 5 .  Vitamin D, Ergocalciferol, (DRISDOL) 50000 units CAPS capsule, Take 50,000 Units by mouth every 7 (seven) days., Disp: , Rfl:  .  loratadine (CLARITIN) 10 MG tablet, Take 10 mg by mouth daily., Disp: , Rfl:   Allergies as of 04/26/2018 - Review Complete 04/26/2018  Allergen Reaction Noted  . Tapazole [methimazole] Hives 04/20/2011    1. 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 She loves her job. She moved back to [place] and lives with her boyfriend. She will start school in August at [employer] for her RN degree.  2. Activities: She is going to the gym and walks three miles on the treadmill.   3. Smoking, alcohol, or drugs: None 4. Primary Care Provider: Dr. Briscoe Deutscher at [facility] at [place], fax [phone] 5. OB-GYN: [facility] on [address]: There are no other significant problems involving Shatiqua's other body systems.   Objective:  Vital Signs:  BP 112/60   Pulse 76   Ht 5' 2.6" (1.59 m)   Wt 163 lb (73.9 kg)   BMI 29.25 kg/m  Repeat HR 112   Ht Readings from Last 3 Encounters:  04/26/18 5' 2.6" (1.59 m)  12/21/17 5' 2.05" (1.576 m)  02/20/14 5\' 1"  (1.549 m)   Wt Readings from Last 3 Encounters:  04/26/18 163 lb (73.9 kg)  12/21/17 162 lb (73.5 kg)  07/20/17 161 lb (73 kg)   HC Readings from Last 3 Encounters:  No data found for [facility]   Body surface area is 1.81 meters squared.  Facility age limit for growth percentiles is 20 years. Facility age limit for growth percentiles is 20 years.   PHYSICAL EXAM:  Constitutional: The patient appears healthy, happy, but still overweight/obese. She looks good today. Her affect and insight are normal. Her sense of humor is quite good. She has gained 1 pound in 4 months. Her weight is >130 % of her IBW, indicating that her weight is in the obese zone   Face: The face appears normal. Eyes: Her eyes are still somewhat prominent, with the left eye being more prominent, but neither eye appears to be more prominent than they were at her last visit. There is no obvious arcus. She has no inferior proptosis of her eyes today. Extra-ocular movements are normal on both sides. The conjunctivae are reddened, c/w allergies. Moisture appears normal. Mouth: The oropharynx and tongue appear normal. Oral moisture is normal.  Neck: The neck appears to be  visibly normal. No carotid bruits are noted. The thyroid gland is smaller at 20 grams in size. Both lobes have shrunk back to normal size. The consistency of the thyroid gland is normal. The thyroid gland is not tender to palpation. Lungs: The lungs are clear to auscultation. Air movement is good. Heart: Heart rate and rhythm are regular. Heart sounds S1 and S2 are normal. I did not appreciate any pathologic cardiac murmurs. Abdomen: The abdomen is more enlarged. Bowel sounds are normal. There is no obvious hepatomegaly, splenomegaly, or other mass effect.  Arms: Muscle size and bulk are normal for age. Hands: There is no obvious tremor. Phalangeal and metacarpophalangeal joints are normal. Palmar muscles are normal. Palmar skin is normal. Palmar moisture is also normal. Legs: Muscles appear normal for age. No edema is present. Neurologic: Strength is normal for age in both the upper and lower extremities. Muscle tone is normal. Sensation to touch is normal in both legs.    LAB DATA:  Labs 04/14/18: TSH 1.06, free T4 1.2, free T3 2.6; PTH 29, calcium 9.6, 25-OH vitamin D 71  Labs 12/15/17: TSH 1.75, free T4 1.4, free T3 2.7; CMP normal; 25-OH vitamin D 70, calcium 8.9, PTH 66 (ref 14-64)  Labs 07/06/17: TSH 1.35, free T4 1.4, free T3 3.2; iron 101 (ref 40-190), CBC normal   Labs 03/30/17: TSI 114 (ref <140), TSH 0.82, free T4 1.4, free T3 2.6; calcium 9.7, PTH 47, 25-OH vitamin D 57  Labs 01/02/17: TSH 4.83, free  T4 1.0, free T3 2.2, TSI 147 (ref <140)  Labs 10/23/16: PTH 83 (ref 14-64), calcium 9.3, 25-OH vitamin D 10 (ref 30-100)  Labs 10/11/16: TSH 0.74, free T4 1.2, free T3 2.7, TSI 147  Labs 07/07/16: TSH 1.08, free T4 1.2, free T3 2.9, TSI < 89  Labs 04/13/16: TSH 3.54, free T4 1.30, free T3 2.4  Labs 12/17/15: TSH 2.35, free T4 1.40, free T3 2.7, TSI 94  Labs 10/04/15: TSH 4.290, free T4 1.15, free T3 2.4, TSI 62 (ref <140)  Labs 03/30/15:TSH 0.382, free T4 1.29, free T3 3.1  Labs 08/19/14: TSH 1.027, free T4 1.07, free T3 2.6  Labs 05/20/14: TSH 0.736, free T4 1.34, free T3 3.1,   Labs 10/20/13; TSH 2.414, free T4 1.2, free T3 2.8  Labs 08/05/13: TSH 8.221, free T4 1.18, free T3 2.5. She may have missed the half-dose last Sunday.  Labs 04/25/13: TSH 0.225, free T4 1.30, free T3 2.8  Labs 02/17/13: TSH 3.069, free T4 1.27, free T3 2.5  Labs 08/06/12: TSH 3.072, free T4 1.22, free T3 2.7  Labs 12/12/11: TSH 2.456, free T4 1.49, Free T3 2.7, FSH 2.9, LH 3.5, TSI 82 (<140%),     Assessment and Plan:   ASSESSMENT:  1-3. Hypothyroid, s/p I-131 therapy for Graves' disease on 3/23/7 and 10/12/06/ Graves' Dz/Hashimoto's thyroiditis    A. Patient's TFTs and TSI levels have continued to fluctuate over time. We have had to adjust her Synthroid doses over time to compensate for changes in TSI and TFT levels.   B. At her visit in March 2018, she was mildly hypothyroid, so I increased her Synthroid dose by about 7% to 100 mcg/day every day. She has continued on that dose ever since. She is mid-range euthyroid now.   D. Her TSI in May 2018 was back within normal limits, but still relatively high. Her TFTs in May 2018 were normal at about 70% of the true normal thyroid hormone range.   E.  Her TFTs in February 2019 were normal at about 45% of the normal range. Her TFTs in June 2019 were at about the 60% of the normal range.   F. Her Graves Dz is still fighting with her Hashimoto's Dz for dominance,  with the background of being hypothyroid due to her radioactive iodine therapy. On most days the Hashimoto's disease is her major factor. We are doing our best to keep her Synthroid doses appropriate to her needs 4. Goiter: Her thyroid gland has shrunk back to normal size today. Her thyroiditis appears to be clinically quiescent.  5. Exophthalmos: Her left eye is still somewhat more prominent than the right eye, but essentially unchanged since her last visit. There is no eye proptosis, lid lag, or restriction of extra-ocular muscle movements in either eye today. Her exophthalmos parallels her TSI activity.   6. Oligomenorrhea and menometrorrhagia: Resolved 7. Overweight/obesity: She continues to gain weight at the rate of about 27.5 calories per day. She needs to increase her exercise and /or decrease her carb intake.  8-9. Vitamin D deficiency/hyperparathyroidism/relative hypocalcemia:   A. Her December 2017 labs showed that she was hyperparathyroid secondary to vitamin D deficiency disease. Since starting Biotech, however, her lab tests in May 2018 showed that her vitamin D level had normalized and her PTH level had normalized.   B. Her February 2019 labs show that her vitamin D was in the middle of the normal range, but her PTH was slightly elevated and her calcium was still low normal, despite the effect of the increased PTH to cause extraction of calcium from her bone stores. These labs show that she was not taking in enough calcium, so she had secondary hyperparathyroidism, secondary then to inadequate calcium intake. She needed at least one good calcium pill per day. The Citracal form of calcium citrate was preferred. I asked her to continue the One-A-Day for Women.   C. Her labs in June 2019 show that despite taking the Citracal only about twice a week, her calcium has increased and her PTH has decreased as expected physiologically.  10. Dyspepsia/reflux: These problems had significantly improved  after taking ranitidine, but then worsened when she increased her carb intake and decreased her activity level. She needs to decrease her carb intake.  11. Fatigue, other. She is doing much better today. She does not have any anemia or iron deficiency.  PLAN:  1. Diagnostic: TFTs, CMP, 25-OH vitamin D, calcium, PTH in 6 months.  2. Therapeutic: Continue the Synthroid dose of 100 mcg/day every day. Continue Biotech. Continue One-A-Day for women daily. Continue ranitidine, 300 mg at dinner. Continue two Citracal 500-600 mg tablets peer week.  Eat Right and exercise daily. 3. Patient education: We discussed issues of Graves' Dz, Hashimoto's Dz, exophthalmos, and the need to adjust her Synthroid doses accordingly. We also discussed her obesity and the need to eat right and to exercise. We discussed vitamin D intake, calcium intake, and secondary hyperparathyroidism. We also discussed that if she were to become pregnant again, she would need more Synthroid during the pregnancy.   4. Follow-up: 6 months, or every 2 months if she becomes pregnant again. Call me if she becomes pregnant again.   Level of Service: This visit lasted in excess of 50 minutes. More than 50% of the visit was devoted to counseling.  Tillman Sers, MD, CDE Adult and Pediatric Endocrinology 04/26/2018 10:43 AM

## 2018-05-17 ENCOUNTER — Other Ambulatory Visit (INDEPENDENT_AMBULATORY_CARE_PROVIDER_SITE_OTHER): Payer: Self-pay | Admitting: "Endocrinology

## 2018-05-17 DIAGNOSIS — E890 Postprocedural hypothyroidism: Secondary | ICD-10-CM

## 2018-08-30 DIAGNOSIS — R05 Cough: Secondary | ICD-10-CM | POA: Diagnosis not present

## 2018-08-30 DIAGNOSIS — R69 Illness, unspecified: Secondary | ICD-10-CM | POA: Diagnosis not present

## 2018-09-25 ENCOUNTER — Telehealth (INDEPENDENT_AMBULATORY_CARE_PROVIDER_SITE_OTHER): Payer: Self-pay | Admitting: "Endocrinology

## 2018-09-25 NOTE — Telephone Encounter (Signed)
°  Who's calling (name and relationship to patient) : Self  Best contact number: [phone]   Provider they see: Tobe Sos   Reason for call: Calling to inform Dr. Tobe Sos she is pregnant. Please call.      PRESCRIPTION REFILL ONLY  Name of prescription:  Pharmacy:

## 2018-09-25 NOTE — Telephone Encounter (Signed)
Routed to provider

## 2018-10-01 ENCOUNTER — Telehealth (INDEPENDENT_AMBULATORY_CARE_PROVIDER_SITE_OTHER): Payer: Self-pay | Admitting: "Endocrinology

## 2018-10-01 NOTE — Telephone Encounter (Signed)
°  Who's calling (name and relationship to patient) : Jessicalynn (mom) Best contact number: [phone] Provider they see: Tobe Sos  Reason for call: Patient is requesting Dr Tobe Sos to order a pregnancy test lab for her upcoming labs for December.     PRESCRIPTION REFILL ONLY  Name of prescription:  Pharmacy:

## 2018-10-01 NOTE — Telephone Encounter (Signed)
Routed to provider.  Is this ok

## 2018-10-01 NOTE — Telephone Encounter (Signed)
Routed to provider.  Is this OK??

## 2018-10-22 ENCOUNTER — Telehealth (INDEPENDENT_AMBULATORY_CARE_PROVIDER_SITE_OTHER): Payer: Self-pay | Admitting: "Endocrinology

## 2018-10-22 NOTE — Telephone Encounter (Signed)
1. Patient called to state that she is pregnant.  2. When I tried to return her call she was not available. I left a voicemail message asking her to return my call. Tillman Sers, MD, CDE

## 2018-11-01 ENCOUNTER — Ambulatory Visit (INDEPENDENT_AMBULATORY_CARE_PROVIDER_SITE_OTHER): Payer: BLUE CROSS/BLUE SHIELD | Admitting: "Endocrinology

## 2018-11-01 DIAGNOSIS — E890 Postprocedural hypothyroidism: Secondary | ICD-10-CM | POA: Diagnosis not present

## 2018-11-01 DIAGNOSIS — E559 Vitamin D deficiency, unspecified: Secondary | ICD-10-CM | POA: Diagnosis not present

## 2018-11-01 DIAGNOSIS — E211 Secondary hyperparathyroidism, not elsewhere classified: Secondary | ICD-10-CM | POA: Diagnosis not present

## 2018-11-04 LAB — T3, FREE: T3 FREE: 2.6 pg/mL (ref 2.3–4.2)

## 2018-11-04 LAB — VITAMIN D 25 HYDROXY (VIT D DEFICIENCY, FRACTURES): VIT D 25 HYDROXY: 47 ng/mL (ref 30–100)

## 2018-11-04 LAB — PTH, INTACT AND CALCIUM
Calcium: 9.8 mg/dL (ref 8.6–10.2)
PTH: 56 pg/mL (ref 14–64)

## 2018-11-04 LAB — T4, FREE: FREE T4: 1.3 ng/dL (ref 0.8–1.8)

## 2018-11-04 LAB — TSH: TSH: 1.09 mIU/L

## 2018-11-05 ENCOUNTER — Encounter (INDEPENDENT_AMBULATORY_CARE_PROVIDER_SITE_OTHER): Payer: Self-pay | Admitting: "Endocrinology

## 2018-11-05 ENCOUNTER — Ambulatory Visit (INDEPENDENT_AMBULATORY_CARE_PROVIDER_SITE_OTHER): Payer: BLUE CROSS/BLUE SHIELD | Admitting: "Endocrinology

## 2018-11-05 VITALS — BP 120/82 | HR 80 | Ht 62.01 in | Wt 166.0 lb

## 2018-11-05 DIAGNOSIS — E349 Endocrine disorder, unspecified: Secondary | ICD-10-CM

## 2018-11-05 DIAGNOSIS — E890 Postprocedural hypothyroidism: Secondary | ICD-10-CM

## 2018-11-05 DIAGNOSIS — E559 Vitamin D deficiency, unspecified: Secondary | ICD-10-CM

## 2018-11-05 DIAGNOSIS — E063 Autoimmune thyroiditis: Secondary | ICD-10-CM | POA: Diagnosis not present

## 2018-11-05 DIAGNOSIS — I10 Essential (primary) hypertension: Secondary | ICD-10-CM

## 2018-11-05 DIAGNOSIS — E0500 Thyrotoxicosis with diffuse goiter without thyrotoxic crisis or storm: Secondary | ICD-10-CM | POA: Diagnosis not present

## 2018-11-05 DIAGNOSIS — E211 Secondary hyperparathyroidism, not elsewhere classified: Secondary | ICD-10-CM

## 2018-11-05 DIAGNOSIS — R5383 Other fatigue: Secondary | ICD-10-CM

## 2018-11-05 DIAGNOSIS — R1013 Epigastric pain: Secondary | ICD-10-CM

## 2018-11-05 NOTE — Patient Instructions (Signed)
Follow up visit in 6 months. Please rpeapt lab tests 1-2 weeks prior.

## 2018-11-05 NOTE — Progress Notes (Signed)
Subjective:  Patient Name: Jessica Maxwell Date of Birth: [birth year]  MRN: [id number]  Jessica Maxwell  presents to the office today for follow-up of her hypothyroidism s/p I-131 therapy for Graves' disease, exophthalmos, goiter, Hashimoto's thyroiditis, fatigue, dyspepsia, hirsutism, hypocalcemia, vitamin D deficiency, and secondary hyperparathyroidism.  HISTORY OF PRESENT ILLNESS:   [name] is a 42 y.o. African-American woman.  [name] was unaccompanied.  1. The patient was referred to me on 11/07/2005 by Dr. Milagros Evener of the [facility] for evaluation and management of  thyrotoxicosis caused by Graves' disease, exophthalmos, weight loss, tachycardia, and tremor. The patient was then 42 years old.  A. The patient had begun to develop symptoms and signs of thyrotoxicosis at age 31-19. By the Summer of  2006 she had begun to miss menstrual periods. She saw Dr. Radene Ou in October 2006. Dr. Radene Ou diagnosed Graves disease clinically and ordered lab tests. On 08/28/05 the TSH was 0.022. Free T4 was 6.28. Dr. Radene Ou started the patient on methimazole, which reduced the free T4 to 1.6 by 09/19/05, but the patient developed an extensive rash on day 21 of therapy, so Dr. Radene Ou appropriately discontinued the medication on 09/22/05.  Thyroid US on 09/04/05 showed a 10x16 mm nodule in the right superior pole. Echotexture in both lobes was inhomogeneous. Radioactive iodine uptake on 10/06/05 was 79% (normal 15-30%). The previously identified "nodule" had avid uptake, similar to the uptake in the remainder of the thyroid gland. The radiologic impression was: "findings compatible with diffuse toxic goiter". Dr. Radene Ou also treated her with atenolol, 50 mg/day.  B. At the time the patient first saw me, she was quite hyperthyroid again. Her past medical history was remarkable only for a unilateral salpingo-oophorectomy for an ectopic pregnancy. Her family history was positive for a paternal  grandfather who had been diagnosed with Graves' disease and exophthalmos and a maternal aunt who had been diagnosed with hypothyroidism.  C. On physical exam, her weight was 125 pounds, her BP was 130/88. Her heart rate was 96. Her eyes were very prominent and she had both inferior proptosis and stare. She had 1+ tongue tremor and 2+ carotid/thyroid bruits. Her thyroid gland was 30-35 grams in size and had a lobulated, firm consistency. She had a grade III/VI systolic flow murmur. She also had 1+ tremor of her outstretched fingers. TSH was.020. Free T4 was 5.0. T3 was 816 (normal 60-181). TPO antibody was 3,873.2 (normal <40-60). TSI was 3.1 (normal < 1.3). Alkaline phosphatase was 179 (normal 39-117), consistent with increased bone turnover due to hyperthyroidism.   D.The patient had a combination of both Graves' Disease with exophthalmos and Hashimoto's Thyroiditis. Her Graves' disease was clearly dominant. Due to her allergic reaction to methimazole, it was quite likely that she might have a similar reaction to propylthiouracil (PTU). I discussed the advantages and disadvantages with her of PTU therapy, I-131 therapy, and thyroidectomy. I recommended I-131 therapy and she concurred. On 01/26/06 she was treated with 12.55 mCi of I-131. Unfortunately, that dose did not destroy enough thyrocytes so she required a second dose of 26.3 mCi of I-131 on 10/12/06.  By 0/18/08 she was hypothyroid, with a TSH of 19.9, free T4 of 0.46, and free T3 of 1.2. I started her on a daily dose of 88 mcg of Synthroid at that time.   2. During the last 13 years we have worked with [name] about several issues. For a detailed discussion of these issues see my encounter note from 12/21/17:   A. [name]  thyroid hormone levels have fluctuated based upon her Graves disease activity, her Hashimoto's disease activity, her compliance with taking Synthroid, and her absorption of the Synthroid. As a result we have adjusted her Synthroid doses many  time.  B. Her Graves' disease exophthalmos has improved over time, but persists. When her allergies act up she has more conjunctival erythema and irritation than a person without exophthalmos would have.   C. She has had problems several times with secondary hyperparathyroidism due to vitamin D deficiency and hypocalcemia due to inadequate calcium intake.   D. She has gradually gained more weight over time, become mildly obese, and developed dyspepsia.   5. The patient's last [facility] visit was on 04/26/18. At that visit I continued her Synthroid dose of 100 mcg/day every day. I also continued her Biotech once weekly.   A. She stopped the ranitidine due to the adverse publicity. Her reflux has not recurred. She has had more belly hunger.   B. She has not been taking the Biotech weekly or the CitracaI daily as often as she should.    C. In the interim she has been healthy, except for one flu-like illness. Her allergies have been quiescent.   D. She became pregnant again in October and had another spontaneous miscarriage on 10/06/18.  E. She is not pregnant and does not plan to become pregnant. However, she and her boyfriend are still active sexually and are not using any birth control.     F. She is not going to the gym anymore. Due to a job change, she walks on the job all day. She is still eating out at restaurants a lot.   6. Review of Systems: She has been healthy.  Constitutional: The patient feels "good". Energy level is "good". She has not been tired. She has not been jittery or hyper.  Eyes: Eyes are about the same. She wears her glasses all the time now. She feels that her left eye is still more prominent than the right eye. She has no complaints of ocular pressure with "up and out" gaze in either eye. There are no significant eye complaints.  Neck: The patient has no complaints of anterior neck swelling, soreness, tenderness,  pressure, discomfort, or difficulty swallowing.  Heart: Heart rate  increases with exercise or other physical activity. The patient has no complaints of palpitations, irregular heat beats, chest pain, or chest pressure. Gastrointestinal: She has more belly hunger during the day. Her carb intake has remained fairly stable.  The patient has no complaints of stomach aches or pains, diarrhea, or constipation since reducing the Citracal. Legs: Muscle mass and strength seem normal. There are no complaints of numbness, tingling, burning, or pain. No edema is noted. Feet: There are no obvious foot problems. There are no complaints of numbness, tingling, burning, or pain. No edema is noted. GYN: LMP was 11/02/18. Periods are regular.    PAST MEDICAL, FAMILY, AND SOCIAL HISTORY:  Past Medical History:  Diagnosis Date  . Fatigue   . Hypothyroid   . Hypothyroidism following radioiodine therapy   . Menometrorrhagia   . Secondary oligomenorrhea   . Thyroiditis, autoimmune   . Thyrotoxicosis with diffuse goiter   . Toxic diffuse goiter with exophthalmos     Family History  Problem Relation Age of Onset  . Heart disease Father   . Cancer Father        small bowel cancer   . Thyroid disease Maternal Aunt  hypothyroid  . Heart disease Maternal Aunt   . Heart disease Paternal Aunt   . Thyroid disease Paternal Grandfather        hyperthyroid  . Heart disease Paternal Grandfather   . Diabetes Neg Hx      Current Outpatient Medications:  .  SYNTHROID 100 MCG tablet, TAKE 1 TABLET BY MOUTH EVERY DAY, Disp: 30 tablet, Rfl: 5 .  Vitamin D, Ergocalciferol, (DRISDOL) 50000 units CAPS capsule, Take 50,000 Units by mouth every 7 (seven) days., Disp: , Rfl:  .  loratadine (CLARITIN) 10 MG tablet, Take 10 mg by mouth daily., Disp: , Rfl:  .  ranitidine (ZANTAC) 150 MG tablet, Take 1 tablet (150 mg total) by mouth 2 (two) times daily. (Patient not taking: Reported on 11/05/2018), Disp: 60 tablet, Rfl: 6  Allergies as of 11/05/2018 - Review Complete 11/05/2018   Allergen Reaction Noted  . Tapazole [methimazole] Hives 04/20/2011    1. 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 She loves her job. She moved back to [place] and lives with her boyfriend. She started school in August 2019 at [employer] for her BSN/RN degree.  2. Activities: She walks a lot at work.    3. Smoking, alcohol, or drugs: None 4. Primary Care Provider: Dr. Briscoe Deutscher at [facility] [place], fax [phone] 5. OB-GYN: [facility] [address]: There are no other significant problems involving Laasya's other body systems.   Objective:  Vital Signs:  BP 120/82   Pulse 80   Ht 5' 2.01" (1.575 m)   Wt 166 lb (75.3 kg)   BMI 30.35 kg/m    Repeat BP 140/96 Ht Readings from Last 3 Encounters:  11/05/18 5' 2.01" (1.575 m)  04/26/18 5' 2.6" (1.59 m)  12/21/17 5' 2.05" (1.576 m)   Wt Readings from Last 3 Encounters:  11/05/18 166 lb (75.3 kg)  04/26/18 163 lb (73.9 kg)  12/21/17 162 lb (73.5 kg)   HC Readings from Last 3 Encounters:  No data found for [place]   Body surface area is 1.82 meters squared.  Facility age limit for growth percentiles is 20 years. Facility age limit for growth percentiles is 20 years.   PHYSICAL EXAM:  Constitutional: The patient appears healthy, happy, but still overweight/obese.  She has gained 3 pound in 6 months. Her weight is >130 % of her IBW, indicating that her weight is in the obese zone. She looks good overall today. Her affect and insight are normal. Her sense of humor is quite good. Face: The face appears normal. Eyes: Her eyes are still somewhat prominent, with the left eye being more prominent, but neither eye appears to be more prominent than they were at her last several visits. There is no obvious arcus. She has no inferior proptosis of her eyes today. Extra-ocular movements are normal on both sides. Moisture appears normal. Mouth: The oropharynx and tongue  appear normal. Oral moisture is normal.  Neck: The neck appears to be visibly normal. No carotid bruits are noted. The thyroid gland is smaller at 20 grams in size. Both lobes have shrunk back to normal size. The consistency of the thyroid gland is normal. The thyroid gland is not tender to palpation. Lungs: The lungs are clear to auscultation. Air movement is good. Heart: Heart rate and rhythm are regular. Heart sounds S1 and S2 are normal. I did not appreciate any pathologic cardiac murmurs. Abdomen:  The abdomen is more enlarged. Bowel sounds are normal. There is no obvious hepatomegaly, splenomegaly, or other mass effect.  Arms: Muscle size and bulk are normal for age. Hands: There is no obvious tremor. Phalangeal and metacarpophalangeal joints are normal. Palmar muscles are normal. Palmar skin is normal. Palmar moisture is also normal. Legs: Muscles appear normal for age. No edema is present. Neurologic: Strength is normal for age in both the upper and lower extremities. Muscle tone is normal. Sensation to touch is normal in both legs.    LAB DATA:  Labs 11/01/18: TSH 1.09, free T4 1.3, free T3 2.6; PTH 56, calcium 9.8, 25-OH vitamin D 47  Labs 04/14/18: TSH 1.06, free T4 1.2, free T3 2.6; PTH 29, calcium 9.6, 25-OH vitamin D 71  Labs 12/15/17: TSH 1.75, free T4 1.4, free T3 2.7; CMP normal; 25-OH vitamin D 70, calcium 8.9, PTH 66 (ref 14-64)  Labs 07/06/17: TSH 1.35, free T4 1.4, free T3 3.2; iron 101 (ref 40-190), CBC normal   Labs 03/30/17: TSI 114 (ref <140), TSH 0.82, free T4 1.4, free T3 2.6; calcium 9.7, PTH 47, 25-OH vitamin D 57  Labs 01/02/17: TSH 4.83, free T4 1.0, free T3 2.2, TSI 147 (ref <140)  Labs 10/23/16: PTH 83 (ref 14-64), calcium 9.3, 25-OH vitamin D 10 (ref 30-100)  Labs 10/11/16: TSH 0.74, free T4 1.2, free T3 2.7, TSI 147  Labs 07/07/16: TSH 1.08, free T4 1.2, free T3 2.9, TSI < 89  Labs 04/13/16: TSH 3.54, free T4 1.30, free T3 2.4  Labs 12/17/15: TSH 2.35, free  T4 1.40, free T3 2.7, TSI 94  Labs 10/04/15: TSH 4.290, free T4 1.15, free T3 2.4, TSI 62 (ref <140)  Labs 03/30/15:TSH 0.382, free T4 1.29, free T3 3.1  Labs 08/19/14: TSH 1.027, free T4 1.07, free T3 2.6  Labs 05/20/14: TSH 0.736, free T4 1.34, free T3 3.1,   Labs 10/20/13; TSH 2.414, free T4 1.2, free T3 2.8  Labs 08/05/13: TSH 8.221, free T4 1.18, free T3 2.5. She may have missed the half-dose last Sunday.  Labs 04/25/13: TSH 0.225, free T4 1.30, free T3 2.8  Labs 02/17/13: TSH 3.069, free T4 1.27, free T3 2.5  Labs 08/06/12: TSH 3.072, free T4 1.22, free T3 2.7  Labs 12/12/11: TSH 2.456, free T4 1.49, Free T3 2.7, FSH 2.9, LH 3.5, TSI 82 (<140%),     Assessment and Plan:   ASSESSMENT:  1-3. Hypothyroid, s/p I-131 therapy for Graves' disease on 3/23/7 and 10/12/06/ Graves' Dz/Hashimoto's thyroiditis    A. Patient's TFTs and TSI levels have continued to fluctuate over time. We have had to adjust her Synthroid doses over time to compensate for changes in TSI and TFT levels.   B. At her visit in March 2018, she was mildly hypothyroid, so I increased her Synthroid dose by about 7% to 100 mcg/day every day. She has continued on that dose ever since. She is mid-range euthyroid now.   D. Her TSI in May 2018 was back within normal limits, but still relatively high. Her TFTs in May 2018 were normal at about 70% of the true normal thyroid hormone range.   E. Her TFTs in February 2019 were normal at about 45% of the normal range. Her TFTs in June 2019 were at about the 60% of the normal range. Her TFTS in December 2019 were again at about the 60% of the normal range.   F. Her Graves Dz is still fighting with her Hashimoto's Dz  for dominance, with the background of being hypothyroid due to her radioactive iodine therapy. On most days the Hashimoto's disease is her major factor. We are doing our best to keep her Synthroid doses appropriate to her needs 4. Goiter: Her thyroid gland has shrunk back  to normal size again today. Her thyroiditis appears to be clinically quiescent.  5. Exophthalmos: Her left eye is still somewhat more prominent than the right eye, but essentially unchanged from her last several visits. There is no eye proptosis, lid lag, or restriction of extra-ocular muscle movements in either eye today. Her exophthalmos parallels her TSI activity.   6. Oligomenorrhea and menometrorrhagia: Resolved 7. Overweight/obesity: She gained weight during her brief pregnancy. She needs to increase her exercise and /or decrease her carb intake.  8-9. Vitamin D deficiency/secondary hyperparathyroidism/relative hypocalcemia:   A. Her December 2017 labs showed that she was hyperparathyroid secondary to vitamin D deficiency disease. Since starting Biotech, however, her lab tests in May 2018 showed that her vitamin D level had normalized and her PTH level had normalized.   B. Her February 2019 labs show that her vitamin D was in the middle of the normal range, but her PTH was slightly elevated and her calcium was still low normal, despite the effect of the increased PTH to cause resorption of calcium from her bone stores. These labs show that she was not taking in enough calcium, so she had secondary hyperparathyroidism, secondary then to inadequate calcium intake. She needed at least one good calcium pill per day. The Citracal form of calcium citrate was preferred. I asked her to continue the One-A-Day for Women.   C. Her labs in June 2019 showed that despite taking the Citracal only about twice a week, her calcium had increased and her PTH had decreased as expected physiologically. In December 2019, however, her vitamin D decreased and her PTH increased, all c/w missing too many doses of vitamin D and calcium.  10. Dyspepsia/reflux: These problems had significantly improved after taking ranitidine, but then worsened when she stopped the ranitidine,  increased her carb intake, and decreased her activity  level. She needs to decrease her carb intake.  11. Fatigue, other. She is doing much better today. She does not have any anemia or iron deficiency. 12. Hypertension: Her BP has increased. She also has  family history of hypertension in both parents. She needs to check her BPs frequently in the next month. If they remain high, she needs to see her PCP.   PLAN:  1. Diagnostic: TFTs, CMP, 25-OH vitamin D, calcium, PTH in 6 months.  2. Therapeutic: Continue the Synthroid dose of 100 mcg/day every day. Continue Biotech weekly. Continue One-A-Day for women daily. Continue two Citracal 500-600 mg tablets peer week.  Eat Right and exercise daily. 3. Patient education: We discussed issues of Graves' Dz, Hashimoto's Dz, exophthalmos, and the need to adjust her Synthroid doses accordingly. We also discussed her obesity and the need to eat right and to exercise. We discussed vitamin D intake, calcium intake, and secondary hyperparathyroidism. We also discussed that if she were to become pregnant again, she would need more Synthroid during the pregnancy.   4. Follow-up: 6 months, or every 2 months if she becomes pregnant again. Call me if she becomes pregnant again.   Level of Service: This visit lasted in excess of 50 minutes. More than 50% of the visit was devoted to counseling.  Tillman Sers, MD, CDE Adult and Pediatric Endocrinology 11/05/2018 10:44 AM

## 2018-11-14 ENCOUNTER — Other Ambulatory Visit (INDEPENDENT_AMBULATORY_CARE_PROVIDER_SITE_OTHER): Payer: Self-pay | Admitting: "Endocrinology

## 2018-11-14 DIAGNOSIS — E890 Postprocedural hypothyroidism: Secondary | ICD-10-CM

## 2019-01-29 ENCOUNTER — Telehealth (INDEPENDENT_AMBULATORY_CARE_PROVIDER_SITE_OTHER): Payer: Self-pay | Admitting: "Endocrinology

## 2019-01-29 NOTE — Telephone Encounter (Signed)
°  Who's calling (name and relationship to patient) : Laurissa, Cowper Best contact number: [phone] Provider they see: Tobe Sos  Reason for call: Ermie is very concerned about her health right now with all of the COVID-19 concerns.  Sharday would like Dr. Loren Racer view on her working with the health problems she has on top of working at [employer].  Please call    [place]  Name of prescription:  Pharmacy:

## 2019-01-29 NOTE — Telephone Encounter (Signed)
Spoke to patient, advised to follow CDC guidelines,and the guidelines her employer has inplace. She voiced understanding.

## 2019-03-10 DIAGNOSIS — E890 Postprocedural hypothyroidism: Secondary | ICD-10-CM | POA: Diagnosis not present

## 2019-03-10 DIAGNOSIS — E211 Secondary hyperparathyroidism, not elsewhere classified: Secondary | ICD-10-CM | POA: Diagnosis not present

## 2019-03-10 DIAGNOSIS — E559 Vitamin D deficiency, unspecified: Secondary | ICD-10-CM | POA: Diagnosis not present

## 2019-03-11 ENCOUNTER — Telehealth (INDEPENDENT_AMBULATORY_CARE_PROVIDER_SITE_OTHER): Payer: Self-pay | Admitting: "Endocrinology

## 2019-03-11 LAB — T3, FREE: T3 FREE: 2.8 pg/mL (ref 2.3–4.2)

## 2019-03-11 LAB — VITAMIN D 25 HYDROXY (VIT D DEFICIENCY, FRACTURES): VIT D 25 HYDROXY: 67 ng/mL (ref 30–100)

## 2019-03-11 LAB — T4, FREE: Free T4: 1.4 ng/dL (ref 0.8–1.8)

## 2019-03-11 LAB — PTH, INTACT AND CALCIUM
Calcium: 10 mg/dL (ref 8.6–10.2)
PTH: 28 pg/mL (ref 14–64)

## 2019-03-11 LAB — TSH: TSH: 0.97 m[IU]/L

## 2019-03-11 NOTE — Telephone Encounter (Signed)
1. Pat called. 2. Subjective:   A. She has been having palpitations in the past week. She got on the scale and saw that she had lost 7 pounds. She has eating normally, or maybe a little less. Her heart rate has been up to 105. She has been taking in a lot of caffeine. She had lab tests done yesterday. She has been having more headaches recently when she goes without caffeine too long. She is not unusually warm.  B. She still takes Synthroid, 100 mcg/day. 3 Objective:  A. TSH 0.97, free T4 1.4, free T3 2.8  B. PTH 28, calcium 10.0, 25-OH vitamin D 67 4. Assessment:  A. She is euthyroid.  B. She has hypercaffeinism. This episode is similar to the episode she had several years ago.  5. Plan:  A. Record for the next 24 hours all the ounces of caffeine she takes in.   B. Reduce the caffeine intake by 25% for a week.   C. If HR <80, reduce the caffeine by another 25%. Repeat the cycles as needed.  D. Call if having problems.   Tillman Sers, MD, CDE Adult and Pediatric Endocrinology

## 2019-05-05 ENCOUNTER — Telehealth (INDEPENDENT_AMBULATORY_CARE_PROVIDER_SITE_OTHER): Payer: Self-pay | Admitting: "Endocrinology

## 2019-05-05 ENCOUNTER — Other Ambulatory Visit (INDEPENDENT_AMBULATORY_CARE_PROVIDER_SITE_OTHER): Payer: Self-pay | Admitting: *Deleted

## 2019-05-05 DIAGNOSIS — E890 Postprocedural hypothyroidism: Secondary | ICD-10-CM

## 2019-05-05 MED ORDER — LEVOTHYROXINE SODIUM 100 MCG PO TABS: 100.0000 ug | ORAL_TABLET | Freq: Every day | ORAL | 1 refills | Status: DC

## 2019-05-05 NOTE — Telephone Encounter (Signed)
°  Who's calling (name and relationship to patient) : Lititia (pt)  Best contact number: [phone]  Provider they see: Tobe Sos  Reason for call: Patient call cancelled appt for tomorrow.  Need a refill of medication     PRESCRIPTION REFILL ONLY  Name of prescription: Synthroid  143mcg  Pharmacy: [facility] [address]

## 2019-05-05 NOTE — Telephone Encounter (Signed)
Attempted to call with no success, script sent, patient needs to reschedule appt.

## 2019-05-06 ENCOUNTER — Ambulatory Visit (INDEPENDENT_AMBULATORY_CARE_PROVIDER_SITE_OTHER): Payer: BLUE CROSS/BLUE SHIELD | Admitting: "Endocrinology

## 2019-10-22 ENCOUNTER — Other Ambulatory Visit (INDEPENDENT_AMBULATORY_CARE_PROVIDER_SITE_OTHER): Payer: Self-pay | Admitting: "Endocrinology

## 2019-10-22 DIAGNOSIS — E890 Postprocedural hypothyroidism: Secondary | ICD-10-CM

## 2019-10-23 ENCOUNTER — Other Ambulatory Visit (INDEPENDENT_AMBULATORY_CARE_PROVIDER_SITE_OTHER): Payer: Self-pay | Admitting: *Deleted

## 2019-10-23 ENCOUNTER — Telehealth (INDEPENDENT_AMBULATORY_CARE_PROVIDER_SITE_OTHER): Payer: Self-pay | Admitting: "Endocrinology

## 2019-10-23 DIAGNOSIS — E890 Postprocedural hypothyroidism: Secondary | ICD-10-CM

## 2019-10-23 MED ORDER — LEVOTHYROXINE SODIUM 100 MCG PO TABS: 100.0000 ug | ORAL_TABLET | Freq: Every day | ORAL | 1 refills | Status: DC

## 2019-10-23 NOTE — Telephone Encounter (Signed)
Spoke to patient, advised that Script sent.

## 2019-10-23 NOTE — Telephone Encounter (Signed)
  Who's calling (name and relationship to patient) : Jessica Maxwell   Best contact number: [phone]  Provider they see: Dr Tobe Sos   Reason for call: Patient called very upset that she is unable to pick up her Synthroid, and would like to have a refill as soon as possible.  Also, scheduled a follow up with provider so she is able to receive her refills.      PRESCRIPTION REFILL ONLY  Name of prescription: Synthroid 100MCG   Pharmacy:  [facility]  [place], [place]

## 2019-10-24 ENCOUNTER — Ambulatory Visit (INDEPENDENT_AMBULATORY_CARE_PROVIDER_SITE_OTHER): Payer: BLUE CROSS/BLUE SHIELD | Admitting: "Endocrinology

## 2019-12-05 ENCOUNTER — Other Ambulatory Visit (INDEPENDENT_AMBULATORY_CARE_PROVIDER_SITE_OTHER): Payer: Self-pay

## 2019-12-05 ENCOUNTER — Other Ambulatory Visit (INDEPENDENT_AMBULATORY_CARE_PROVIDER_SITE_OTHER): Payer: Self-pay | Admitting: "Endocrinology

## 2019-12-05 DIAGNOSIS — E890 Postprocedural hypothyroidism: Secondary | ICD-10-CM | POA: Diagnosis not present

## 2019-12-05 DIAGNOSIS — E0500 Thyrotoxicosis with diffuse goiter without thyrotoxic crisis or storm: Secondary | ICD-10-CM

## 2019-12-05 DIAGNOSIS — E063 Autoimmune thyroiditis: Secondary | ICD-10-CM

## 2019-12-05 DIAGNOSIS — E559 Vitamin D deficiency, unspecified: Secondary | ICD-10-CM

## 2019-12-05 DIAGNOSIS — E211 Secondary hyperparathyroidism, not elsewhere classified: Secondary | ICD-10-CM

## 2019-12-05 DIAGNOSIS — E349 Endocrine disorder, unspecified: Secondary | ICD-10-CM

## 2019-12-08 LAB — ADVANCED WRITTEN NOTIFICATION (AWN) TEST REFUSAL: AWN TEST REFUSED: 17306

## 2019-12-08 LAB — T3, FREE: T3, Free: 2.9 pg/mL (ref 2.3–4.2)

## 2019-12-08 LAB — PTH, INTACT AND CALCIUM
Calcium: 9.5 mg/dL (ref 8.6–10.2)
PTH: 63 pg/mL (ref 14–64)

## 2019-12-08 LAB — T4, FREE: Free T4: 1.3 ng/dL (ref 0.8–1.8)

## 2019-12-08 LAB — TSH: TSH: 0.21 mIU/L — ABNORMAL LOW

## 2019-12-09 ENCOUNTER — Telehealth (INDEPENDENT_AMBULATORY_CARE_PROVIDER_SITE_OTHER): Payer: Self-pay | Admitting: "Endocrinology

## 2019-12-09 NOTE — Telephone Encounter (Signed)
Routed to provider

## 2019-12-09 NOTE — Telephone Encounter (Signed)
  Who's calling (name and relationship to patient) : Lataya Sollars self  Best contact number: [phone]  Provider they see: Dr. Tobe Sos  Reason for call: Patient called to give an update on how she felt. She described her situation as the follow: Weight is down 146.6, labs are off, and feels jittery. Please call with any advise.    PRESCRIPTION REFILL ONLY  Name of prescription:  Pharmacy:

## 2019-12-10 ENCOUNTER — Ambulatory Visit (INDEPENDENT_AMBULATORY_CARE_PROVIDER_SITE_OTHER): Payer: BLUE CROSS/BLUE SHIELD | Admitting: "Endocrinology

## 2019-12-15 ENCOUNTER — Telehealth: Payer: Self-pay | Admitting: "Endocrinology

## 2019-12-15 DIAGNOSIS — E038 Other specified hypothyroidism: Secondary | ICD-10-CM

## 2019-12-15 NOTE — Telephone Encounter (Signed)
1. Subjective: [name] called to say she has been more jittery as of February 1st. She is taking Synthroid 100 mcg/day and vitamin D, 50.000 IU/week. She has also been taking in more caffeine lately. She also stopped taking calcium due to getting constipated.  2. Objective:  She had lab tests done on 12/05/19. I reviewed them with her:  A. TFTs: TSH 0.21, free T4 1.3, free T3 2;9  B. Bone-mineral: PTH 63, calcium 9.5 4. Assessment:  A. She is hyperthyroid now. Her Graves' disease may be partially re-activating again.  B. Her PTH is much higher and her calcium is lower. She needs to resume calcium, but not as much.   5. Plan:   A. Stop Synthroid for the next 5 days. Then resume Synthroid, but take 100 mcg/day for 6 days each week. Repeat TFTs in 6 weeks.  B. Resume calcium every other day if possible.   C. I told [name] that since she has not had a visit with me since December 2019, I can no longer write prescriptions for her. She told me that she is going to school and working full time, so she does not have time to see me. She asked if I would do a video visit. I said that I would, but she would have to call our front office to schedule the visit. She agreed.   Tillman Sers, MD, CDE

## 2019-12-24 ENCOUNTER — Other Ambulatory Visit: Payer: Self-pay

## 2019-12-24 ENCOUNTER — Telehealth (INDEPENDENT_AMBULATORY_CARE_PROVIDER_SITE_OTHER): Payer: BC Managed Care – PPO | Admitting: "Endocrinology

## 2019-12-24 DIAGNOSIS — E063 Autoimmune thyroiditis: Secondary | ICD-10-CM | POA: Diagnosis not present

## 2019-12-24 DIAGNOSIS — E0500 Thyrotoxicosis with diffuse goiter without thyrotoxic crisis or storm: Secondary | ICD-10-CM | POA: Diagnosis not present

## 2019-12-24 DIAGNOSIS — R251 Tremor, unspecified: Secondary | ICD-10-CM | POA: Diagnosis not present

## 2019-12-24 NOTE — Progress Notes (Signed)
Subjective:  Patient Name: Jessica Maxwell Date of Birth: July 27, 1976  MRN: [id number]  Darleth Lyford  presents for her MyChart visit today for follow-up of her hypothyroidism s/p I-131 therapy for Graves' disease, exophthalmos, goiter, Hashimoto's thyroiditis, fatigue, dyspepsia, hirsutism, hypocalcemia, vitamin D deficiency, and secondary hyperparathyroidism.  HISTORY OF PRESENT ILLNESS:   Jessica Maxwell is a 44 y.o. African-American woman.  Jessica Maxwell was unaccompanied.  1. The patient was referred to me on 11/07/2005 by Dr. Milagros Evener of the [facility] for evaluation and management of  thyrotoxicosis caused by Graves' disease, exophthalmos, weight loss, tachycardia, and tremor. The patient was then 44 years old.  A. The patient had begun to develop symptoms and signs of thyrotoxicosis at age 35-19. By the Summer of  2006 she had begun to miss menstrual periods. She saw Dr. Radene Ou in October 2006. Dr. Radene Ou diagnosed Graves disease clinically and ordered lab tests. On 08/28/05 the TSH was 0.022. Free T4 was 6.28. Dr. Radene Ou started the patient on methimazole, which reduced the free T4 to 1.6 by 09/19/05, but the patient developed an extensive rash on day 21 of therapy, so Dr. Radene Ou appropriately discontinued the medication on 09/22/05.  Thyroid US on 09/04/05 showed a 10x16 mm nodule in the right superior pole. Echotexture in both lobes was inhomogeneous. Radioactive iodine uptake on 10/06/05 was 79% (normal 15-30%). The previously identified "nodule" had avid uptake, similar to the uptake in the remainder of the thyroid gland. The radiologic impression was: "findings compatible with diffuse toxic goiter". Dr. Radene Ou also treated her with atenolol, 50 mg/day.  B. At the time the patient first saw me, she was quite hyperthyroid again. Her past medical history was remarkable only for a unilateral salpingo-oophorectomy for an ectopic pregnancy. Her family history was positive for a paternal  grandfather who had been diagnosed with Graves' disease and exophthalmos and a maternal aunt who had been diagnosed with hypothyroidism.  C. On physical exam, her weight was 125 pounds, her BP was 130/88. Her heart rate was 96. Her eyes were very prominent and she had both inferior proptosis and stare. She had 1+ tongue tremor and 2+ carotid/thyroid bruits. Her thyroid gland was 30-35 grams in size and had a lobulated, firm consistency. She had a grade III/VI systolic flow murmur. She also had 1+ tremor of her outstretched fingers. TSH was.020. Free T4 was 5.0. T3 was 816 (normal 60-181). TPO antibody was 3,873.2 (normal <40-60). TSI was 3.1 (normal < 1.3). Alkaline phosphatase was 179 (normal 39-117), consistent with increased bone turnover due to hyperthyroidism.   D. The patient had a combination of both Graves' Disease with exophthalmos and Hashimoto's Thyroiditis. Her Graves' disease was clearly dominant. Due to her allergic reaction to methimazole, it was quite likely that she might have a similar reaction to propylthiouracil (PTU). I discussed the advantages and disadvantages with her of PTU therapy, I-131 therapy, and thyroidectomy. I recommended I-131 therapy and she concurred. On 01/26/06 she was treated with 12.55 mCi of I-131. Unfortunately, that dose did not destroy enough thyrocytes so she required a second dose of 26.3 mCi of I-131 on 10/12/06.  By 0/18/08 she was hypothyroid, with a TSH of 19.9, free T4 of 0.46, and free T3 of 1.2. I started her on a daily dose of 88 mcg of Synthroid at that time.   2. During the last 13 years we have worked with Jessica Maxwell about several issues. For a detailed discussion of these issues see my encounter note from 12/21/17:  A. Jessica Maxwell thyroid hormone levels have fluctuated based upon flare ups of her Graves disease activity, flare ups of her Hashimoto's disease activity, her compliance with taking Synthroid, and her absorption of the Synthroid. As a result we have  adjusted her Synthroid doses many time.  B. Her Graves' disease exophthalmos has improved over time, but persists. When her allergies act up she has more conjunctival erythema and irritation than a person without exophthalmos would have.   C. She has had problems several times with secondary hyperparathyroidism due to vitamin D deficiency and hypocalcemia due to inadequate calcium intake.   D. She has gradually gained more weight over time, become mildly obese, and developed dyspepsia.   5. The patient's last [facility] visit was on 11/05/18. At that visit I continued her Synthroid dose of 100 mcg/day every day. I also continued her Biotech once weekly.   A. The patient called me on 12/15/19. She was jittery. She told me that she had recently had thyroid tests performed. After reviewing her lab results from 12/05/19, I confirmed that she was hyperthyroid. I told her to hold the thyroid hormone for 5 days and then resume the 100 mcg/day for 6 days each week,but skip the dose on one day each week.. She is now more sluggish than she was last week..     B. In the interim she has been healthy overall. Her allergies have been quiescent.   C. She has not had any additional pregnancies.   D. She is back in school at [employer] for nursing. She is also still working at [employer].  E.. She is busy, but has not been exercising much.    F. Her weight is down to 146.  G. She has been taking 50,000 IU weekly. She had stopped taking calcium but recently resumed taking it.  6. Review of Systems: She has been healthy.  Constitutional: The patient feels "sluggish". Energy level is "lower". She has not been tired. She is not jittery or hyper anymore.  Eyes: Eyes are about the same. She wears her glasses all the time now. She feels that her left eye is more pronounced than the right eye. She has no complaints of ocular pressure with "up and out" gaze in either eye. There are no significant eye complaints.  Neck: The patient has no  complaints of anterior neck swelling, soreness, tenderness,  pressure, discomfort, or difficulty swallowing.  Heart: Heart rate increases with exercise or other physical activity. The patient has no complaints of palpitations, irregular heat beats, chest pain, or chest pressure. Gastrointestinal: She has more belly hunger in the mornings. Her carb intake has remained fairly stable.  The patient has no complaints of stomach aches or pains, diarrhea, or constipation since stopping the Citracal. Legs: Muscle mass and strength seem normal. She has some knee pains bilaterally, more on the right. She notes the knee pains when she is walking down stairs. There are no complaints of numbness, tingling, burning, or pain. No edema is noted. Feet: There are no obvious foot problems. There are no complaints of numbness, tingling, burning, or pain. No edema is noted. GYN: LMP was 11/29/19. Periods occur regularly.    PAST MEDICAL, FAMILY, AND SOCIAL HISTORY:  Past Medical History:  Diagnosis Date  . Fatigue   . Hypothyroid   . Hypothyroidism following radioiodine therapy   . Menometrorrhagia   . Secondary oligomenorrhea   . Thyroiditis, autoimmune   . Thyrotoxicosis with diffuse goiter   . Toxic diffuse goiter with  exophthalmos     Family History  Problem Relation Age of Onset  . Heart disease Father   . Cancer Father        small bowel cancer   . Thyroid disease Maternal Aunt        hypothyroid  . Heart disease Maternal Aunt   . Heart disease Paternal Aunt   . Thyroid disease Paternal Grandfather        hyperthyroid  . Heart disease Paternal Grandfather   . Diabetes Neg Hx      Current Outpatient Medications:  .  levothyroxine (SYNTHROID) 100 MCG tablet, Take 1 tablet (100 mcg total) by mouth daily., Disp: 90 tablet, Rfl: 1 .  loratadine (CLARITIN) 10 MG tablet, Take 10 mg by mouth daily., Disp: , Rfl:  .  Vitamin D, Ergocalciferol, (DRISDOL) 50000 units CAPS capsule, Take 50,000 Units by  mouth every 7 (seven) days., Disp: , Rfl:  .  ranitidine (ZANTAC) 150 MG tablet, Take 1 tablet (150 mg total) by mouth 2 (two) times daily. (Patient not taking: Reported on 11/05/2018), Disp: 60 tablet, Rfl: 6  Allergies as of 12/24/2019 - Review Complete 12/24/2019  Allergen Reaction Noted  . Tapazole [methimazole] Hives 04/20/2011    1. 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 She loves her job. She moved back to [place] and lives with her boyfriend. She attends [employer] for her BSN/RN degree.  2. Activities: She walks a lot at work.    3. Smoking, alcohol, or drugs: None 4. Primary Care Provider: Dr. Briscoe Deutscher at [facility] at [place], fax [phone] 5. OB-GYN: [facility] on [address]: There are no other significant problems involving Takyah's other body systems.   Objective:  Vital Signs:  There were no vitals taken for this visit.   Ht Readings from Last 3 Encounters:  11/05/18 5' 2.01" (1.575 m)  04/26/18 5' 2.6" (1.59 m)  12/21/17 5' 2.05" (1.576 m)   Wt Readings from Last 3 Encounters:  11/05/18 166 lb (75.3 kg)  04/26/18 163 lb (73.9 kg)  12/21/17 162 lb (73.5 kg)   HC Readings from Last 3 Encounters:  No data found for HC   There is no height or weight on file to calculate BSA.  Facility age limit for growth percentiles is 20 years. Facility age limit for growth percentiles is 20 years.  Her recent weight was 146. She has lost a great deal of weight since beginning work in the new clinic in August. Her appetite has decreased in the past several months.   PHYSICAL EXAM:  Constitutional: The patient appears healthy and less overweight.  She looks good overall today. Her affect and insight are normal. Her sense of humor is good. Face: The face appears normal. Eyes: Her eyes are still somewhat prominent, with the left eye being more prominent, but  neither eye appears to be more prominent than they were at her last several visits. There is no obvious arcus. She has no inferior proptosis of her eyes today. Extra-ocular movements are normal on both sides. Neck: The neck appears to be visibly normal.   LAB DATA:  Labs 12/05/19: TSH 0.21, free T4 1.3, free T3 2.9; PTH 63, calcium 9.5  Labs 11/01/18: TSH 1.09, free T4 1.3, free T3 2.6; PTH 56, calcium 9.8, 25-OH vitamin D 47  Labs 04/14/18: TSH 1.06, free T4 1.2, free T3 2.6; PTH  29, calcium 9.6, 25-OH vitamin D 71  Labs 12/15/17: TSH 1.75, free T4 1.4, free T3 2.7; CMP normal; 25-OH vitamin D 70, calcium 8.9, PTH 66 (ref 14-64)  Labs 07/06/17: TSH 1.35, free T4 1.4, free T3 3.2; iron 101 (ref 40-190), CBC normal   Labs 03/30/17: TSI 114 (ref <140), TSH 0.82, free T4 1.4, free T3 2.6; calcium 9.7, PTH 47, 25-OH vitamin D 57  Labs 01/02/17: TSH 4.83, free T4 1.0, free T3 2.2, TSI 147 (ref <140)  Labs 10/23/16: PTH 83 (ref 14-64), calcium 9.3, 25-OH vitamin D 10 (ref 30-100)  Labs 10/11/16: TSH 0.74, free T4 1.2, free T3 2.7, TSI 147  Labs 07/07/16: TSH 1.08, free T4 1.2, free T3 2.9, TSI < 89  Labs 04/13/16: TSH 3.54, free T4 1.30, free T3 2.4  Labs 12/17/15: TSH 2.35, free T4 1.40, free T3 2.7, TSI 94  Labs 10/04/15: TSH 4.290, free T4 1.15, free T3 2.4, TSI 62 (ref <140)  Labs 03/30/15:TSH 0.382, free T4 1.29, free T3 3.1  Labs 08/19/14: TSH 1.027, free T4 1.07, free T3 2.6  Labs 05/20/14: TSH 0.736, free T4 1.34, free T3 3.1,   Labs 10/20/13; TSH 2.414, free T4 1.2, free T3 2.8  Labs 08/05/13: TSH 8.221, free T4 1.18, free T3 2.5. She may have missed the half-dose last Sunday.  Labs 04/25/13: TSH 0.225, free T4 1.30, free T3 2.8  Labs 02/17/13: TSH 3.069, free T4 1.27, free T3 2.5  Labs 08/06/12: TSH 3.072, free T4 1.22, free T3 2.7  Labs 12/12/11: TSH 2.456, free T4 1.49, Free T3 2.7, FSH 2.9, LH 3.5, TSI 82 (<140%),     Assessment and Plan:   ASSESSMENT:  1-3.  Hypothyroid, s/p I-131 therapy for Graves' disease on 3/23/7 and 10/12/06/ Graves' Dz/Hashimoto's thyroiditis    A. Patient's TFTs and TSI levels have continued to fluctuate over time. We have had to adjust her Synthroid doses over time to compensate for changes in TSI and TFT levels.   B. At her last clinic visit on 11/05/18, her TFTS in were again at about the 60% of the physiological normal range.   C. Her recent hyperthyroidism with tremor was likely due to a recurrence of Graves' disease. In such a circumstance, we need to reduce Synthroid hormone as much as necessary to keep her TSH in the goal range of 1.0-2.0.   D. Her Graves Dz is still fighting with her 64 Dz for dominance, with the background of being hypothyroid due to her radioactive iodine therapy. On most days the Hashimoto's disease is her major factor. We are doing our best to keep her Synthroid doses appropriate to her needs 4. Goiter: Her thyroid gland had shrunk back to normal size in December 2019.  Her thyroiditis appeared to be clinically quiescent.  5. Exophthalmos: Her left eye is still somewhat more prominent than the right eye, but appears essentially unchanged from her last several visits. There is no eye proptosis, lid lag, or restriction of extra-ocular muscle movements in either eye today. Her exophthalmos parallels her TSI activity.   6. Oligomenorrhea and menometrorrhagia: Resolved 7. Overweight/obesity: Resolved.  8-9. Vitamin D deficiency/secondary hyperparathyroidism/relative hypocalcemia:   A. Her December 2017 labs showed that she was hyperparathyroid secondary to vitamin D deficiency disease. Since starting Biotech, however, her lab tests in May 2018 showed that her vitamin D level had normalized and her PTH level had normalized.   B. Her February 2019 labs show that her vitamin D was in  the middle of the normal range, but her PTH was slightly elevated and her calcium was still low normal, despite the effect  of the increased PTH to cause resorption of calcium from her bone stores. These labs show that she was not taking in enough calcium, so she had secondary hyperparathyroidism, secondary then to inadequate calcium intake. She needed at least one good calcium pill per day. The Citracal form of calcium citrate was preferred. I asked her to continue the One-A-Day for Women.   C. Her labs in June 2019 showed that despite taking the Citracal only about twice a week, her calcium had increased and her PTH had decreased as expected physiologically. In December 2019, however, her vitamin D decreased and her PTH increased, all c/w missing too many doses of vitamin D and calcium.   D. Her labs in January 2021 showed a higher PTH and lower calcium since completely stopping her calcium intake due to constipation. I asked her to resume taking calcium and add Miralax or Metamucil as needed.  10. Dyspepsia/reflux: These problems had significantly improved after taking ranitidine, but then worsened when she stopped the ranitidine,  increased her carb intake, and decreased her activity level. I suggested she try one of he OTC proton pump inhibitors.   11. Fatigue, other. She is more tired since being off Synthroid for 5 days.  12. Hypertension: Her BP had increased in December 2019. She also had  family history of hypertension in both parents. She needed to check her BPs frequently in the next month. If they remained high, she needed to see her PCP.   PLAN:  1. Diagnostic: Repeat TFTs and TSI in 6 weeks.  2. Therapeutic: Continue the Synthroid dose of 100 mcg/day for 6 days each week.  Continue Biotech weekly. Continue One-A-Day for Women daily. Resume two Citracal 500-600 mg tablets per week.  Eat Right and exercise daily. 3. Patient education: We discussed issues of Graves' Dz, Hashimoto's Dz, exophthalmos, and the need to adjust her Synthroid doses accordingly. We discussed vitamin D intake, calcium intake, and secondary  hyperparathyroidism. We also discussed that if she were to become pregnant again, she would need more Synthroid during the pregnancy.   4. Follow-up: 3 months  Level of Service: This visit lasted in excess of 50 minutes. More than 50% of the visit was devoted to counseling.  Tillman Sers, MD, CDE Adult and Pediatric Endocrinology 12/24/2019 2:17 PM

## 2019-12-24 NOTE — Patient Instructions (Signed)
Follow up visit in 3 months. Please repeat lab tests in 6 weeks.

## 2020-02-02 DIAGNOSIS — E0500 Thyrotoxicosis with diffuse goiter without thyrotoxic crisis or storm: Secondary | ICD-10-CM | POA: Diagnosis not present

## 2020-02-06 LAB — TSH: TSH: 3.73 mIU/L

## 2020-02-06 LAB — T3, FREE: T3, Free: 2.3 pg/mL (ref 2.3–4.2)

## 2020-02-06 LAB — THYROID STIMULATING IMMUNOGLOBULIN: TSI: <89 % baseline (ref <140)

## 2020-02-06 LAB — T4, FREE: Free T4: 1.1 ng/dL (ref 0.8–1.8)

## 2020-02-16 ENCOUNTER — Encounter (INDEPENDENT_AMBULATORY_CARE_PROVIDER_SITE_OTHER): Payer: Self-pay | Admitting: *Deleted

## 2020-03-23 ENCOUNTER — Encounter (INDEPENDENT_AMBULATORY_CARE_PROVIDER_SITE_OTHER): Payer: Self-pay | Admitting: "Endocrinology

## 2020-03-23 ENCOUNTER — Other Ambulatory Visit: Payer: Self-pay

## 2020-03-23 ENCOUNTER — Ambulatory Visit (INDEPENDENT_AMBULATORY_CARE_PROVIDER_SITE_OTHER): Payer: BC Managed Care – PPO | Admitting: "Endocrinology

## 2020-03-23 VITALS — BP 122/80 | HR 74 | Wt 154.8 lb

## 2020-03-23 DIAGNOSIS — I10 Essential (primary) hypertension: Secondary | ICD-10-CM

## 2020-03-23 DIAGNOSIS — E0500 Thyrotoxicosis with diffuse goiter without thyrotoxic crisis or storm: Secondary | ICD-10-CM

## 2020-03-23 DIAGNOSIS — E663 Overweight: Secondary | ICD-10-CM

## 2020-03-23 DIAGNOSIS — E890 Postprocedural hypothyroidism: Secondary | ICD-10-CM

## 2020-03-23 DIAGNOSIS — E063 Autoimmune thyroiditis: Secondary | ICD-10-CM

## 2020-03-23 DIAGNOSIS — E349 Endocrine disorder, unspecified: Secondary | ICD-10-CM

## 2020-03-23 DIAGNOSIS — E559 Vitamin D deficiency, unspecified: Secondary | ICD-10-CM

## 2020-03-23 DIAGNOSIS — R1013 Epigastric pain: Secondary | ICD-10-CM

## 2020-03-23 DIAGNOSIS — R5383 Other fatigue: Secondary | ICD-10-CM

## 2020-03-23 MED ORDER — LISINOPRIL 2.5 MG PO TABS: 2.5000 mg | ORAL_TABLET | Freq: Every day | ORAL | 11 refills | Status: DC

## 2020-03-23 NOTE — Progress Notes (Signed)
Subjective:  Patient Name: Jessica Maxwell Date of Birth: May 06, 1976  MRN: [id number]  Jessica Maxwell  presents for her MyChart visit today for follow-up of her hypothyroidism s/p I-131 therapy for Graves' disease, exophthalmos, goiter, Hashimoto's thyroiditis, fatigue, dyspepsia, hirsutism, hypocalcemia, vitamin D deficiency, and secondary hyperparathyroidism.  HISTORY OF PRESENT ILLNESS:   Jessica Maxwell is a 44 y.o. African-American woman.  Jessica Maxwell was unaccompanied.  1. The patient was referred to me on 11/07/2005 by Dr. Milagros Evener of the [facility] for evaluation and management of  thyrotoxicosis caused by Graves' disease, exophthalmos, weight loss, tachycardia, and tremor. The patient was then 44 years old.  A. The patient had begun to develop symptoms and signs of thyrotoxicosis at age 44-19. By the Summer of  2006 she had begun to miss menstrual periods. She saw Dr. Radene Ou in October 2006. Dr. Radene Ou diagnosed Graves disease clinically and ordered lab tests. On 08/28/05 the TSH was 0.022. Free T4 was 6.28. Dr. Radene Ou started the patient on methimazole, which reduced the free T4 to 1.6 by 09/19/05, but the patient developed an extensive rash on day 21 of therapy, so Dr. Radene Ou appropriately discontinued the medication on 09/22/05.  Thyroid US on 09/04/05 showed a 10x16 mm nodule in the right superior pole. Echotexture in both lobes was inhomogeneous. Radioactive iodine uptake on 10/06/05 was 79% (normal 15-30%). The previously identified "nodule" had avid uptake, similar to the uptake in the remainder of the thyroid gland. The radiologic impression was: "findings compatible with diffuse toxic goiter". Dr. Radene Ou also treated her with atenolol, 50 mg/day.  B. At the time the patient first saw me, she was quite hyperthyroid again. Her past medical history was remarkable only for a unilateral salpingo-oophorectomy for an ectopic pregnancy. Her family history was positive for a paternal  grandfather who had been diagnosed with Graves' disease and exophthalmos and a maternal aunt who had been diagnosed with hypothyroidism.  C. On physical exam, her weight was 125 pounds, her BP was 130/88. Her heart rate was 96. Her eyes were very prominent and she had both inferior proptosis and stare. She had 1+ tongue tremor and 2+ carotid/thyroid bruits. Her thyroid gland was 30-35 grams in size and had a lobulated, firm consistency. She had a grade III/VI systolic flow murmur. She also had 1+ tremor of her outstretched fingers. TSH was.020. Free T4 was 5.0. T3 was 816 (normal 60-181). TPO antibody was 3,873.2 (normal <40-60). TSI was 3.1 (normal < 1.3). Alkaline phosphatase was 179 (normal 39-117), consistent with increased bone turnover due to hyperthyroidism.   D. The patient had a combination of both Graves' Disease with exophthalmos and Hashimoto's Thyroiditis. Her Graves' disease was clearly dominant. Due to her allergic reaction to methimazole, it was quite likely that she might have a similar reaction to propylthiouracil (PTU). I discussed the advantages and disadvantages with her of PTU therapy, I-131 therapy, and thyroidectomy. I recommended I-131 therapy and she concurred. On 01/26/06 she was treated with 12.55 mCi of I-131. Unfortunately, that dose did not destroy enough thyrocytes so she required a second dose of 26.3 mCi of I-131 on 10/12/06.  By 0/18/08 she was hypothyroid, with a TSH of 19.9, free T4 of 0.46, and free T3 of 1.2. I started her on a daily dose of 88 mcg of Synthroid at that time.   2. During the last 14 years we have worked with [name] about several issues. For a detailed discussion of these issues see my encounter note from 12/21/17:  A. [name] thyroid hormone levels have fluctuated based upon flare ups of her Graves disease activity, flare ups of her Hashimoto's disease activity, her compliance with taking Synthroid, and her absorption of the Synthroid. As a result we have  adjusted her Synthroid doses many time.  B. Her Graves' disease exophthalmos has improved over time, but persists. When her allergies act up she has more conjunctival erythema and irritation than a person without exophthalmos would have.   C. She has had problems several times with secondary hyperparathyroidism due to vitamin D deficiency and hypocalcemia due to inadequate calcium intake.   D. She has gradually gained more weight over time, become mildly obese, and developed dyspepsia.   5. The patient's last [facility] televisit was on 12/24/19. At that visit I continued her Synthroid dose of 100 mcg/day for 6 days per week. She feels better on that dose, less jittery and jumpy. I also continued her Biotech once weekly.   A. In the interim she has been healthy overall.She has her covid vaccinations. Her allergies have been "terrible".   B. She has not had any additional pregnancies.   C. She is back in school at [employer] for nursing. She is also still working at [employer].  D. She is busy, but has not been exercising much.    E. Her weight is up to 154 pounds.  F. She has been taking 50,000 IU weekly. She also takes calcium at times.   6. Review of Systems: She has been healthy.  Constitutional: The patient feels "good". Energy level is "good". She has not been tired. She is not jittery or hyper anymore. She sleeps well, but dreams a lot.  Eyes: Eyes are about the same. She wears her glasses all the time now. She feels that her left eye is more pronounced than the right eye. She has no complaints of ocular pressure with "up and out" gaze in either eye. There are no significant eye complaints.  Neck: The patient has no complaints of anterior neck swelling, soreness, tenderness,  pressure, discomfort, or difficulty swallowing.  Heart: Heart rate increases with exercise or other physical activity. The patient has no complaints of palpitations, irregular heat beats, chest pain, or chest pressure. Gastrointestinal: She  has little belly hunger in the mornings. Her carb intake has remained fairly stable.  The patient has no complaints of stomach aches or pains, diarrhea, or constipation since stopping the Citracal. Legs: Muscle mass and strength seem normal. She no longer has  knee pains when she is walking down stairs. There are no complaints of numbness, tingling, burning, or pain. No edema is noted. Feet: There are no obvious foot problems. There are no complaints of numbness, tingling, burning, or pain. No edema is noted. GYN: LMP was 03/05/20. Periods occur regularly.    PAST MEDICAL, FAMILY, AND SOCIAL HISTORY:  Past Medical History:  Diagnosis Date  . Fatigue   . Hypothyroid   . Hypothyroidism following radioiodine therapy   . Menometrorrhagia   . Secondary oligomenorrhea   . Thyroiditis, autoimmune   . Thyrotoxicosis with diffuse goiter   . Toxic diffuse goiter with exophthalmos     Family History  Problem Relation Age of Onset  . Heart disease Father   . Cancer Father        small bowel cancer   . Thyroid disease Maternal Aunt        hypothyroid  . Heart disease Maternal Aunt   . Heart disease Paternal Aunt   .  Thyroid disease Paternal Grandfather        hyperthyroid  . Heart disease Paternal Grandfather   . Diabetes Neg Hx      Current Outpatient Medications:  .  levothyroxine (SYNTHROID) 100 MCG tablet, Take 1 tablet (100 mcg total) by mouth daily., Disp: 90 tablet, Rfl: 1 .  Vitamin D, Ergocalciferol, (DRISDOL) 50000 units CAPS capsule, Take 50,000 Units by mouth every 7 (seven) days., Disp: , Rfl:  .  loratadine (CLARITIN) 10 MG tablet, Take 10 mg by mouth daily., Disp: , Rfl:  .  ranitidine (ZANTAC) 150 MG tablet, Take 1 tablet (150 mg total) by mouth 2 (two) times daily. (Patient not taking: Reported on 11/05/2018), Disp: 60 tablet, Rfl: 6  Allergies as of 03/23/2020 - Review Complete 03/23/2020  Allergen Reaction Noted  . Tapazole [methimazole] Hives 04/20/2011    1. 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 She loves her job. She moved back to [place] and lives with her boyfriend. She attends [employer] for her BSN/RN degree.  2. Activities: She walks a lot at work.    3. Smoking, alcohol, or drugs: None 4. Primary Care Provider: Dr. Briscoe Deutscher at [facility] at [place], fax [phone] 5. OB-GYN: [facility] [address]: There are no other significant problems involving Alzora's other body systems.   Objective:  Vital Signs:  BP 120/82   Pulse 74   Wt 154 lb 12.8 oz (70.2 kg)   BMI 28.31 kg/m  Repeat BP after 20 minutes was 122/80.  Ht Readings from Last 3 Encounters:  11/05/18 5' 2.01" (1.575 m)  04/26/18 5' 2.6" (1.59 m)  12/21/17 5' 2.05" (1.576 m)   Wt Readings from Last 3 Encounters:  03/23/20 154 lb 12.8 oz (70.2 kg)  11/05/18 166 lb (75.3 kg)  04/26/18 163 lb (73.9 kg)   HC Readings from Last 3 Encounters:  No data found for [facility]   Body surface area is 1.75 meters squared.  Facility age limit for growth percentiles is 20 years. Facility age limit for growth percentiles is 20 years.   Constitutional: The patient looks healthy and appears physically and emotionally well. She is alert and bright. Her affect and insight are normal.She looks happy.  Eyes: There is no arcus or proptosis.  Mouth: The oropharynx appears normal. The tongue appears normal. There is normal oral moisture. There is no obvious gingivitis. Neck: There are no bruits present. The thyroid gland appears normal in size. The thyroid gland is normal, at approximately 18 grams in size. The consistency of the thyroid gland is normal. There is no thyroid tenderness to palpation. Lungs: The lungs are clear. Air movement is good. Heart: The heart rhythm and rate appear normal. Heart sounds S1 and S2 are normal. I do not appreciate any pathologic heart murmurs. Abdomen:  The abdominal size is mildly enlarged. Bowel sounds are normal. The abdomen is soft and non-tender. There is no obviously palpable hepatomegaly, splenomegaly, or other masses.  Arms: Muscle mass appears appropriate for age.  Hands: There is no obvious tremor. Phalangeal and metacarpophalangeal joints appear normal. Palms are normal. Legs: Muscle mass appears appropriate for age. There is no edema.  Neurologic: Muscle strength is normal for age and gender  in both the upper and the lower extremities. Muscle tone appears normal. Sensation to touch is normal in the legs.    LAB DATA:  Labs 02/02/20: TSH 3.73, free T4 1.1, free T3 2.3, TSI <89  Labs 12/05/19: TSH 0.21, free T4 1.3, free T3 2.9; PTH 63, calcium 9.5  Labs 11/01/18: TSH 1.09, free T4 1.3, free T3 2.6; PTH 56, calcium 9.8, 25-OH vitamin D 47  Labs 04/14/18: TSH 1.06, free T4 1.2, free T3 2.6; PTH 29, calcium 9.6, 25-OH vitamin D 71  Labs 12/15/17: TSH 1.75, free T4 1.4, free T3 2.7; CMP normal; 25-OH vitamin D 70, calcium 8.9, PTH 66 (ref 14-64)  Labs 07/06/17: TSH 1.35, free T4 1.4, free T3 3.2; iron 101 (ref 40-190), CBC normal   Labs 03/30/17: TSI 114 (ref <140), TSH 0.82, free T4 1.4, free T3 2.6; calcium 9.7, PTH 47, 25-OH vitamin D 57  Labs 01/02/17: TSH 4.83, free T4 1.0, free T3 2.2, TSI 147 (ref <140)  Labs 10/23/16: PTH 83 (ref 14-64), calcium 9.3, 25-OH vitamin D 10 (ref 30-100)  Labs 10/11/16: TSH 0.74, free T4 1.2, free T3 2.7, TSI 147  Labs 07/07/16: TSH 1.08, free T4 1.2, free T3 2.9, TSI < 89  Labs 04/13/16: TSH 3.54, free T4 1.30, free T3 2.4  Labs 12/17/15: TSH 2.35, free T4 1.40, free T3 2.7, TSI 94  Labs 10/04/15: TSH 4.290, free T4 1.15, free T3 2.4, TSI 62 (ref <140)  Labs 03/30/15:TSH 0.382, free T4 1.29, free T3 3.1  Labs 08/19/14: TSH 1.027, free T4 1.07, free T3 2.6  Labs 05/20/14: TSH 0.736, free T4 1.34, free T3 3.1,   Labs 10/20/13; TSH 2.414, free T4 1.2, free T3 2.8  Labs 08/05/13: TSH 8.221,  free T4 1.18, free T3 2.5. She may have missed the half-dose last Sunday.  Labs 04/25/13: TSH 0.225, free T4 1.30, free T3 2.8  Labs 02/17/13: TSH 3.069, free T4 1.27, free T3 2.5  Labs 08/06/12: TSH 3.072, free T4 1.22, free T3 2.7  Labs 12/12/11: TSH 2.456, free T4 1.49, Free T3 2.7, FSH 2.9, LH 3.5, TSI 82 (<140%),     Assessment and Plan:   ASSESSMENT:  1-3. Hypothyroid, s/p I-131 therapy for Graves' disease on 3/23/7 and 10/12/06/ Graves' Dz/Hashimoto's thyroiditis    A. Patient's TFTs and TSI levels have continued to fluctuate over time. We have had to adjust her Synthroid doses over time to compensate for changes in TSI and TFT levels.   B. In January 2021 she was hyperthyroid. Her  hyperthyroidism with tremor was likely due to a recurrence of Graves' disease. In such a circumstance, we needed to reduce Synthroid hormone as much as necessary to keep her TSH in the goal range of 1.0-2.0.   C. In March 202 she was euthyroid according to the lab's reference range, but her TSH was above 3.4, which is the value accepted by many thyroidologists for the true physiologic upper limit of normal above.   D. She feels good today and is clinically euthyroid. Although my goal has been to keep her TSH in the range of 1.0-2.0, she may benefit from a different goal range. We will check her TFTs today.   E. Her Graves Dz is still fighting with her Hashimoto's Dz for dominance, with the background of being hypothyroid due to her radioactive iodine therapy. On most days the Hashimoto's disease is her major factor. We are doing our best to keep her Synthroid doses appropriate to her needs 4. Goiter: Her thyroid gland had shrunk back to normal size in December 2019.  Her thyroiditis appeared to be clinically quiescent.  5. Exophthalmos:  Her left eye is still somewhat more prominent than the right eye, but appears essentially unchanged from her last several visits. There is no eye proptosis, lid lag, or  restriction of extra-ocular muscle movements in either eye today. Her exophthalmos parallels her TSI activity.   6. Oligomenorrhea and menometrorrhagia: Resolved 7. Overweight: She is doing better.  8-9. Vitamin D deficiency/secondary hyperparathyroidism/relative hypocalcemia:   A. Her December 2017 labs showed that she was hyperparathyroid secondary to vitamin D deficiency disease. Since starting Biotech, however, her lab tests in May 2018 showed that her vitamin D level had normalized and her PTH level had normalized.   B. Her February 2019 labs show that her vitamin D was in the middle of the normal range, but her PTH was slightly elevated and her calcium was still low normal, despite the effect of the increased PTH to cause resorption of calcium from her bone stores. These labs show that she was not taking in enough calcium, so she had secondary hyperparathyroidism, secondary then to inadequate calcium intake. She needed at least one good calcium pill per day. The Citracal form of calcium citrate was preferred. I asked her to continue the One-A-Day for Women.   C. Her labs in June 2019 showed that despite taking the Citracal only about twice a week, her calcium had increased and her PTH had decreased as expected physiologically. In December 2019, however, her vitamin D decreased and her PTH increased, all c/w missing too many doses of vitamin D and calcium.   D. Her labs in January 2021 showed a higher PTH and lower calcium since completely stopping her calcium intake due to constipation. I asked her to resume taking calcium and add Miralax or Metamucil as needed.  10. Dyspepsia/reflux: These problems had significantly improved after taking ranitidine, but then worsened when she stopped the ranitidine,  increased her carb intake, and decreased her activity level. She is doing better now.    11. Fatigue, other. She is no longer tired.   12. Hypertension: Her BP had increased in December 2019. Her  hypertension is evident again today. She also had  family history of hypertension in both parents. It is time to exercise consistently or to begin antihypertensive treatment. We decided to start low-dose lisinopril treatment.   PLAN:  1. Diagnostic: Repeat TFTs, TSI, PTH, calcium, and 25-OH vitamin D today. Start lisinopril, 2.5 mg/day. 2. Therapeutic: Continue the Synthroid dose of 100 mcg/day for 6 days each week.  Continue Biotech weekly. Continue One-A-Day for Women daily. Resume two Citracal 500-600 mg tablets per week.  Eat Right and exercise daily. 3. Patient education: We discussed issues of Graves' Dz, Hashimoto's Dz, exophthalmos, and the need to adjust her Synthroid doses accordingly. We discussed vitamin D intake, calcium intake, and secondary hyperparathyroidism. We also discussed hypertension. We also discussed that if she were to become pregnant again, she would need more Synthroid during the pregnancy. She would also need to stop the lisinopril immediately.   4. Follow-up: 3 months  Level of Service: This visit lasted in excess of 55 minutes. More than 50% of the visit was devoted to counseling.  Tillman Sers, MD, CDE Adult and Pediatric Endocrinology 03/23/2020 9:47 AM

## 2020-03-23 NOTE — Patient Instructions (Signed)
Follow up visit in 3 months. 

## 2020-03-24 LAB — T4, FREE: Free T4: 1.1 ng/dL (ref 0.8–1.8)

## 2020-03-24 LAB — PTH, INTACT AND CALCIUM
Calcium: 9.2 mg/dL (ref 8.6–10.2)
PTH: 61 pg/mL (ref 14–64)

## 2020-03-24 LAB — VITAMIN D 25 HYDROXY (VIT D DEFICIENCY, FRACTURES): Vit D, 25-Hydroxy: 48 ng/mL (ref 30–100)

## 2020-03-24 LAB — T3, FREE: T3, Free: 2.3 pg/mL (ref 2.3–4.2)

## 2020-03-24 LAB — TSH: TSH: 4.59 mIU/L — ABNORMAL HIGH

## 2020-05-11 ENCOUNTER — Telehealth: Payer: Self-pay | Admitting: "Endocrinology

## 2020-05-11 ENCOUNTER — Telehealth (INDEPENDENT_AMBULATORY_CARE_PROVIDER_SITE_OTHER): Payer: Self-pay | Admitting: "Endocrinology

## 2020-05-11 ENCOUNTER — Telehealth (INDEPENDENT_AMBULATORY_CARE_PROVIDER_SITE_OTHER): Payer: Self-pay

## 2020-05-11 DIAGNOSIS — E559 Vitamin D deficiency, unspecified: Secondary | ICD-10-CM

## 2020-05-11 DIAGNOSIS — E890 Postprocedural hypothyroidism: Secondary | ICD-10-CM

## 2020-05-11 NOTE — Telephone Encounter (Signed)
Patient called again stating she saw office number pop up on phone but was unsure since there was no message left.

## 2020-05-11 NOTE — Telephone Encounter (Signed)
Patient is taking a whole pill 7 days a week. First prenatal July 23. She stopped the lisinopril.

## 2020-05-11 NOTE — Telephone Encounter (Signed)
Who's calling (name and relationship to patient) : [name] (self)  Best contact number: [phone]  Provider they see: Dr. Tobe Sos  Reason for call:  Jessica Maxwell called in stating that Dr. Tobe Sos advised her to notify him if she became pregnant, last cycle was 4/30.  Call ID:      PRESCRIPTION REFILL ONLY  Name of prescription:  Pharmacy:

## 2020-05-11 NOTE — Telephone Encounter (Signed)
1. I called Jessica Maxwell: 2. Subjective: She is pregnant. LMP was 03/05/20. She is taking 100 mcg of Synthroid daily. She has not had her first prenatal visit and has not started prenatal vitamins. 3. Objective: Her last TFTs on 03/23/20 were: TSH 4.59, free T4 1.1, free T3 2.3. Her PTH was 61, calcium 9.2.  4. Assessment: We need to repeat her lab tests this week and adjust her Synthroid dose. She needs to begin prenatal vitamins now.  5. Plan: Order TFTS, calcium, PTH, and 25-OH vitamin D.  Tillman Sers, MD, CDE

## 2020-05-11 NOTE — Telephone Encounter (Signed)
1. Patient called to state that she is probably pregnant.  2. I returned her call, but she was not available. I left a voicemail message asking her to return my call this evening.   Tillman Sers, MD, CDE

## 2020-05-11 NOTE — Telephone Encounter (Signed)
Called patient. Left vm to return call. Per Dr Alfonse Spruce prior notes:We also discussed that if she were to become pregnant again, she would need more Synthroid during the pregnancy. She would also need to stop the lisinopril immediately.

## 2020-05-14 DIAGNOSIS — E890 Postprocedural hypothyroidism: Secondary | ICD-10-CM | POA: Diagnosis not present

## 2020-05-14 DIAGNOSIS — E559 Vitamin D deficiency, unspecified: Secondary | ICD-10-CM | POA: Diagnosis not present

## 2020-05-14 DIAGNOSIS — E0500 Thyrotoxicosis with diffuse goiter without thyrotoxic crisis or storm: Secondary | ICD-10-CM | POA: Diagnosis not present

## 2020-05-14 DIAGNOSIS — E063 Autoimmune thyroiditis: Secondary | ICD-10-CM | POA: Diagnosis not present

## 2020-05-14 DIAGNOSIS — E349 Endocrine disorder, unspecified: Secondary | ICD-10-CM | POA: Diagnosis not present

## 2020-05-17 ENCOUNTER — Telehealth (INDEPENDENT_AMBULATORY_CARE_PROVIDER_SITE_OTHER): Payer: Self-pay | Admitting: "Endocrinology

## 2020-05-17 ENCOUNTER — Other Ambulatory Visit (INDEPENDENT_AMBULATORY_CARE_PROVIDER_SITE_OTHER): Payer: Self-pay | Admitting: "Endocrinology

## 2020-05-17 DIAGNOSIS — E890 Postprocedural hypothyroidism: Secondary | ICD-10-CM

## 2020-05-17 LAB — TSH: TSH: 5.86 mIU/L — ABNORMAL HIGH

## 2020-05-17 LAB — PTH, INTACT AND CALCIUM
Calcium: 9.5 mg/dL (ref 8.6–10.2)
PTH: 40 pg/mL (ref 14–64)

## 2020-05-17 LAB — T3, FREE: T3, Free: 2.2 pg/mL — ABNORMAL LOW (ref 2.3–4.2)

## 2020-05-17 LAB — VITAMIN D 25 HYDROXY (VIT D DEFICIENCY, FRACTURES): Vit D, 25-Hydroxy: 51 ng/mL (ref 30–100)

## 2020-05-17 LAB — T4, FREE: Free T4: 1.1 ng/dL (ref 0.8–1.8)

## 2020-05-17 NOTE — Telephone Encounter (Signed)
Who's calling (name and relationship to patient) : Elane Peabody   Best contact number: [phone]  Provider they see: Dr. Tobe Sos   Reason for call: Patient states they had labs done and they had a high level in her TSH. Patient wanted to let Dr. Tobe Sos know asap  Call ID:      [place]  Name of prescription:  Pharmacy:

## 2020-05-20 DIAGNOSIS — N925 Other specified irregular menstruation: Secondary | ICD-10-CM | POA: Diagnosis not present

## 2020-05-20 DIAGNOSIS — O26859 Spotting complicating pregnancy, unspecified trimester: Secondary | ICD-10-CM | POA: Diagnosis not present

## 2020-05-20 DIAGNOSIS — D259 Leiomyoma of uterus, unspecified: Secondary | ICD-10-CM | POA: Diagnosis not present

## 2020-05-24 DIAGNOSIS — O3680X9 Pregnancy with inconclusive fetal viability, other fetus: Secondary | ICD-10-CM | POA: Diagnosis not present

## 2020-05-30 ENCOUNTER — Telehealth: Payer: Self-pay | Admitting: "Endocrinology

## 2020-05-30 NOTE — Telephone Encounter (Signed)
1. I returned her call.  2. She was not available so I left a voicemail message asking her t0 return my call.  Tillman Sers

## 2020-05-31 ENCOUNTER — Telehealth: Payer: Self-pay | Admitting: "Endocrinology

## 2020-05-31 NOTE — Telephone Encounter (Signed)
1. I called the patient again tonight, but she was not available.  2. I left another voicemail message asking her to call me on my cell phone.  Tillman Sers, MD, CDE

## 2020-06-18 DIAGNOSIS — D259 Leiomyoma of uterus, unspecified: Secondary | ICD-10-CM | POA: Diagnosis not present

## 2020-06-18 DIAGNOSIS — O039 Complete or unspecified spontaneous abortion without complication: Secondary | ICD-10-CM | POA: Diagnosis not present

## 2020-06-18 DIAGNOSIS — E079 Disorder of thyroid, unspecified: Secondary | ICD-10-CM | POA: Diagnosis not present

## 2020-06-23 ENCOUNTER — Telehealth: Payer: Self-pay | Admitting: "Endocrinology

## 2020-06-23 ENCOUNTER — Ambulatory Visit (INDEPENDENT_AMBULATORY_CARE_PROVIDER_SITE_OTHER): Payer: BC Managed Care – PPO | Admitting: "Endocrinology

## 2020-06-23 ENCOUNTER — Other Ambulatory Visit (INDEPENDENT_AMBULATORY_CARE_PROVIDER_SITE_OTHER): Payer: Self-pay

## 2020-06-23 ENCOUNTER — Telehealth (INDEPENDENT_AMBULATORY_CARE_PROVIDER_SITE_OTHER): Payer: Self-pay | Admitting: "Endocrinology

## 2020-06-23 DIAGNOSIS — E559 Vitamin D deficiency, unspecified: Secondary | ICD-10-CM

## 2020-06-23 DIAGNOSIS — E890 Postprocedural hypothyroidism: Secondary | ICD-10-CM | POA: Diagnosis not present

## 2020-06-23 NOTE — Telephone Encounter (Signed)
1. I called the patient. She had a miscarriage. Her hCG is still elevated, so she is having labs done every Friday until they turn negative. She will then have an IUD inserted 2. When her lab test were drawn on 05/14/20 she was taking levothyroxine, 100 mcg/day for 6 days per week and 1/2 pill on one day each week. Since seeing those values, however, she has been taking one pill every day She is taking 100 mcg of levothyroxine daily .  3. Her lab tests drawn on 05/14/20 were: TSH 5.86, free T4 1.1, free T3 2.2; PTH 40, calcium 9.5, 25-OH vitamin D 51.  4. She had her lab tests repeated today.  5. We will call her with results.   Tillman Sers, MD, CDE

## 2020-06-23 NOTE — Addendum Note (Signed)
Addended by: Mike Gip A on: 06/23/2020 01:48 PM   Modules accepted: Orders

## 2020-06-23 NOTE — Addendum Note (Signed)
Addended by: Mike Gip A on: 06/23/2020 11:06 AM   Modules accepted: Orders

## 2020-06-23 NOTE — Telephone Encounter (Signed)
°  Who's calling (name and relationship to patient) : Jessica Maxwell (self)  Best contact number: [phone]  Provider they see: Dr. Tobe Sos  Reason for call: Patient has rescheduled her appointment today because she has not had lab work done. She is requesting that we put in lab orders so she can have labs done before her rescheduled appointment.    PRESCRIPTION REFILL ONLY  Name of prescription:  Pharmacy:

## 2020-06-23 NOTE — Telephone Encounter (Addendum)
Spoke with Dr. Tobe Sos, he said "She had her labs done in July, but I did not see them until today. She should come in today for discussion and adjsutment of her medicaitons"  Called patient, she believes it will be a waste of time since her labs were done in July and it is now August, She stated she had a miscarriage in July. Told her I would speak with Dr. Tobe Sos to see if he still would like her to come in today.  Updated Dr. Tobe Sos, he said she does not need to come in today that he will call her later today.  He will put in the orders for labwork.    Laborders placed  Called patient to let her know that she does not need to come in today and he will call her later today.

## 2020-06-25 DIAGNOSIS — O021 Missed abortion: Secondary | ICD-10-CM | POA: Diagnosis not present

## 2020-06-28 LAB — PTH, INTACT AND CALCIUM
Calcium: 9.6 mg/dL (ref 8.6–10.2)
PTH: 53 pg/mL (ref 14–64)

## 2020-06-28 LAB — T4, FREE: Free T4: 1.3 ng/dL (ref 0.8–1.8)

## 2020-06-28 LAB — T3, FREE: T3, Free: 2.8 pg/mL (ref 2.3–4.2)

## 2020-06-28 LAB — VITAMIN D 25 HYDROXY (VIT D DEFICIENCY, FRACTURES): Vit D, 25-Hydroxy: 40 ng/mL (ref 30–100)

## 2020-06-28 LAB — THYROID STIMULATING IMMUNOGLOBULIN: TSI: <89 % baseline (ref <140)

## 2020-06-28 LAB — TSH: TSH: 1.15 mIU/L

## 2020-07-02 DIAGNOSIS — O039 Complete or unspecified spontaneous abortion without complication: Secondary | ICD-10-CM | POA: Diagnosis not present

## 2020-07-09 DIAGNOSIS — O021 Missed abortion: Secondary | ICD-10-CM | POA: Diagnosis not present

## 2020-07-09 DIAGNOSIS — E079 Disorder of thyroid, unspecified: Secondary | ICD-10-CM | POA: Diagnosis not present

## 2020-07-12 ENCOUNTER — Other Ambulatory Visit: Payer: Self-pay

## 2020-07-12 ENCOUNTER — Emergency Department (HOSPITAL_COMMUNITY): Payer: BC Managed Care – PPO

## 2020-07-12 ENCOUNTER — Observation Stay (HOSPITAL_COMMUNITY)
Admission: EM | Admit: 2020-07-12 | Discharge: 2020-07-13 | Disposition: A | Discharge status: Home / Self Care | Payer: BC Managed Care – PPO | Attending: Obstetrics & Gynecology | Admitting: Obstetrics & Gynecology

## 2020-07-12 ENCOUNTER — Encounter (HOSPITAL_COMMUNITY): Payer: Self-pay | Admitting: Emergency Medicine

## 2020-07-12 ENCOUNTER — Ambulatory Visit (HOSPITAL_COMMUNITY)
Admission: EM | Admit: 2020-07-12 | Discharge: 2020-07-12 | Disposition: A | Discharge status: Home / Self Care | Payer: BC Managed Care – PPO | Attending: Family Medicine | Admitting: Family Medicine

## 2020-07-12 DIAGNOSIS — Z20822 Contact with and (suspected) exposure to covid-19: Secondary | ICD-10-CM | POA: Insufficient documentation

## 2020-07-12 DIAGNOSIS — E039 Hypothyroidism, unspecified: Secondary | ICD-10-CM | POA: Insufficient documentation

## 2020-07-12 DIAGNOSIS — Z3201 Encounter for pregnancy test, result positive: Secondary | ICD-10-CM | POA: Diagnosis not present

## 2020-07-12 DIAGNOSIS — N83201 Unspecified ovarian cyst, right side: Secondary | ICD-10-CM | POA: Diagnosis not present

## 2020-07-12 DIAGNOSIS — Z79899 Other long term (current) drug therapy: Secondary | ICD-10-CM | POA: Insufficient documentation

## 2020-07-12 DIAGNOSIS — R102 Pelvic and perineal pain: Secondary | ICD-10-CM | POA: Diagnosis not present

## 2020-07-12 DIAGNOSIS — D259 Leiomyoma of uterus, unspecified: Secondary | ICD-10-CM | POA: Diagnosis not present

## 2020-07-12 DIAGNOSIS — R109 Unspecified abdominal pain: Secondary | ICD-10-CM | POA: Diagnosis present

## 2020-07-12 DIAGNOSIS — R1031 Right lower quadrant pain: Principal | ICD-10-CM | POA: Insufficient documentation

## 2020-07-12 DIAGNOSIS — O039 Complete or unspecified spontaneous abortion without complication: Secondary | ICD-10-CM | POA: Diagnosis not present

## 2020-07-12 DIAGNOSIS — Z3A00 Weeks of gestation of pregnancy not specified: Secondary | ICD-10-CM | POA: Diagnosis not present

## 2020-07-12 DIAGNOSIS — R10813 Right lower quadrant abdominal tenderness: Secondary | ICD-10-CM | POA: Diagnosis not present

## 2020-07-12 LAB — CBC
HCT: 38.7 % (ref 36.0–46.0)
Hemoglobin: 12.2 g/dL (ref 12.0–15.0)
MCH: 29 pg (ref 26.0–34.0)
MCHC: 31.5 g/dL (ref 30.0–36.0)
MCV: 91.9 fL (ref 80.0–100.0)
Platelets: 396 10*3/uL (ref 150–400)
RBC: 4.21 MIL/uL (ref 3.87–5.11)
RDW: 13.5 % (ref 11.5–15.5)
WBC: 11 10*3/uL — ABNORMAL HIGH (ref 4.0–10.5)
nRBC: 0 % (ref 0.0–0.2)

## 2020-07-12 LAB — COMPREHENSIVE METABOLIC PANEL
ALT: 12 U/L (ref 0–44)
AST: 19 U/L (ref 15–41)
Albumin: 3.7 g/dL (ref 3.5–5.0)
Alkaline Phosphatase: 79 U/L (ref 38–126)
Anion gap: 10 (ref 5–15)
BUN: 7 mg/dL (ref 6–20)
CO2: 24 mmol/L (ref 22–32)
Calcium: 9.2 mg/dL (ref 8.9–10.3)
Chloride: 104 mmol/L (ref 98–111)
Creatinine, Ser: 0.72 mg/dL (ref 0.44–1.00)
GFR calc Af Amer: >60 mL/min (ref >60)
GFR calc non Af Amer: >60 mL/min (ref >60)
Glucose, Bld: 87 mg/dL (ref 70–99)
Potassium: 3.8 mmol/L (ref 3.5–5.1)
Sodium: 138 mmol/L (ref 135–145)
Total Bilirubin: <0.1 mg/dL — ABNORMAL LOW (ref 0.3–1.2)
Total Protein: 7 g/dL (ref 6.5–8.1)

## 2020-07-12 LAB — POCT URINALYSIS DIPSTICK, ED / UC
Bilirubin Urine: NEGATIVE
Glucose, UA: NEGATIVE mg/dL
Ketones, ur: NEGATIVE mg/dL
Leukocytes,Ua: NEGATIVE
Nitrite: NEGATIVE
Protein, ur: NEGATIVE mg/dL
Specific Gravity, Urine: 1.025 (ref 1.005–1.030)
Urobilinogen, UA: 0.2 mg/dL (ref 0.0–1.0)
pH: 7 (ref 5.0–8.0)

## 2020-07-12 LAB — I-STAT BETA HCG BLOOD, ED (MC, WL, AP ONLY): I-stat hCG, quantitative: 443.5 m[IU]/mL — ABNORMAL HIGH (ref <5)

## 2020-07-12 LAB — LIPASE, BLOOD: Lipase: 27 U/L (ref 11–51)

## 2020-07-12 LAB — POC URINE PREG, ED: Preg Test, Ur: POSITIVE — AB

## 2020-07-12 MED ORDER — HYDROCODONE-ACETAMINOPHEN 5-325 MG PO TABS
1.0000 | ORAL_TABLET | Freq: Once | ORAL | Status: AC
  Administered 2020-07-12: 1 via ORAL
  Filled 2020-07-12: qty 1

## 2020-07-12 MED ORDER — IOHEXOL 300 MG/ML  SOLN
100.0000 mL | Freq: Once | INTRAMUSCULAR | Status: AC | PRN
  Administered 2020-07-12: 100 mL via INTRAVENOUS

## 2020-07-12 NOTE — H&P (Signed)
Jessica Maxwell is an 44 y.o. female,G4P0040 admitted for observation due to pelvic pain.  Intermittent pain since Thursday, most noticeable after eating.  Pain will resolve with time and feels best when she is sitting up- worse when lying down.  Thought pain had resolved, but then this am after eating a sandwich around 8::30 noted intense 12/10 pain that prompted her to come to ER.  Of note, she has recently been struggling with constipation- BM today, but prior to that last BM was Friday.  Also reports some heartburn/GERD- improved with pepcid.  No nausea/vomiting.  Denies fever, chills, body aches.  Denies urinary changes.  Last IC on Saturday- some RLQ pain, same partner.  Denies vaginal discharge, itching or irritation. Did note some brown spotting after; otherwise has not had bleeding since July  Of note, recent miscarriage 2 months ago, still tracking hCG weekly every Friday and followed by [place].  RECORDS FROM [facility] OFFICE: Pertinent Gynecological History: Menses: 03/05/2020, TRENDING DOWNWARD [place]  05/24/2020 10244 06/18/2020 1777 06/25/2020 1252 07/02/2020 847 07/09/2020 675 Bleeding: +Bleeding from miscarriage 05/21/2020-05/29/2020 spotted.  Contraception: none Sexually transmitted diseases: no past history Previous GYN Procedures: none Last mammogram: normal Date: 10-24-2016 Last pap: normal Date: 11-06-2013 OB History: G4, P0040 (2 ectopics)   Uterine Fibroid noted, Please see Last [place] from 06/18/2020 below:       Past Medical History:  Diagnosis Date  . Fatigue   . Hypothyroid   . Hypothyroidism following radioiodine therapy   . Menometrorrhagia   . Secondary oligomenorrhea   . Thyroiditis, autoimmune   . Thyrotoxicosis with diffuse goiter   . Toxic diffuse goiter with exophthalmos     Past Surgical History:  Procedure Laterality Date  . ECTOPIC PREGNANCY SURGERY      Family History  Problem Relation Age of Onset  . Heart disease Father   . Cancer Father        small  bowel cancer   . Thyroid disease Maternal Aunt        hypothyroid  . Heart disease Maternal Aunt   . Heart disease Paternal Aunt   . Thyroid disease Paternal Grandfather        hyperthyroid  . Heart disease Paternal Grandfather   . Diabetes Neg Hx     Social History:  reports that she has never smoked. She has never used smokeless tobacco. She reports that she does not drink alcohol and does not use drugs.  ALLERGIES/MEDS:  Allergies:  Allergies  Allergen Reactions  . Tapazole [Methimazole] Hives    Review of Systems  Constitutional: Negative for chills and fever.  Respiratory: Negative.   Cardiovascular: Negative.   Gastrointestinal: Positive for constipation and heartburn.  Genitourinary: Negative.   Musculoskeletal: Negative.   Skin: Negative for itching and rash.  Neurological: Negative.   Psychiatric/Behavioral: Negative.     Blood pressure (!) 131/93, pulse 76, temperature 98.4 F (36.9 C), temperature source Oral, resp. rate 17, SpO2 100 %. Physical Exam Vitals reviewed.  Constitutional:      Appearance: She is well-developed.  HENT:     Head: Normocephalic.  Cardiovascular:     Rate and Rhythm: Normal rate and regular rhythm.  Pulmonary:     Effort: Pulmonary effort is normal.     Breath sounds: Normal breath sounds.  Abdominal:     General: There is no distension.     Palpations: Abdomen is soft. There is no mass.     Tenderness: There is no guarding or rebound.  Comments: Enlarged uterus appreciated, no reproducible pain on palpation   Genitourinary:    Comments: Normal external genitalia, pink moist vaginal mucosa, on bimanual exam- some right adnexal fullness appreciated-no tenderness, no discrete mass appreciated Skin:    General: Skin is warm and dry.  Neurological:     Mental Status: She is alert and oriented to person, place, and time.  Psychiatric:        Mood and Affect: Mood normal.        Behavior: Behavior normal.     LABS:  Results for orders placed or performed during the hospital encounter of 07/12/20 (from the past 24 hour(s))  Lipase, blood     Status: None   Collection Time: 07/12/20  2:34 PM  Result Value Ref Range   Lipase 27 11 - 51 U/L  Comprehensive metabolic panel     Status: Abnormal   Collection Time: 07/12/20  2:34 PM  Result Value Ref Range   Sodium 138 135 - 145 mmol/L   Potassium 3.8 3.5 - 5.1 mmol/L   Chloride 104 98 - 111 mmol/L   CO2 24 22 - 32 mmol/L   Glucose, Bld 87 70 - 99 mg/dL   BUN 7 6 - 20 mg/dL   Creatinine, Ser 0.72 0.44 - 1.00 mg/dL   Calcium 9.2 8.9 - 10.3 mg/dL   Total Protein 7.0 6.5 - 8.1 g/dL   Albumin 3.7 3.5 - 5.0 g/dL   AST 19 15 - 41 U/L   ALT 12 0 - 44 U/L   Alkaline Phosphatase 79 38 - 126 U/L   Total Bilirubin <0.1 (L) 0.3 - 1.2 mg/dL   GFR calc non Af Amer >60 >60 mL/min   GFR calc Af Amer >60 >60 mL/min   Anion gap 10 5 - 15  CBC     Status: Abnormal   Collection Time: 07/12/20  2:34 PM  Result Value Ref Range   WBC 11.0 (H) 4.0 - 10.5 K/uL   RBC 4.21 3.87 - 5.11 MIL/uL   Hemoglobin 12.2 12.0 - 15.0 g/dL   HCT 38.7 36 - 46 %   MCV 91.9 80.0 - 100.0 fL   MCH 29.0 26.0 - 34.0 pg   MCHC 31.5 30.0 - 36.0 g/dL   RDW 13.5 11.5 - 15.5 %   Platelets 396 150 - 400 K/uL   nRBC 0.0 0.0 - 0.2 %  I-Stat beta hCG blood, ED     Status: Abnormal   Collection Time: 07/12/20  2:46 PM  Result Value Ref Range   I-stat hCG, quantitative 443.5 (H) <5 mIU/mL   Comment 3            US OB Comp < 14 Wks  Result Date: 07/12/2020 CLINICAL DATA:  Known miscarriage. Right lower quadrant pain. Beta HCG 443 EXAM: OBSTETRIC <14 WK ULTRASOUND TECHNIQUE: Transabdominal ultrasound was performed for evaluation of the gestation as well as the maternal uterus and adnexal regions. COMPARISON:  No prior obstetric exams available. FINDINGS: Intrauterine gestational sac: None Yolk sac:  Not Visualized. Embryo:  Not Visualized. Cardiac Activity: Not Visualized. Maternal  uterus/adnexae: The uterus is enlarged and heterogeneous measuring 16.8 x 7.7 x 11.9 cm. There multiple uterine fibroids, some of which are shadowing/calcified. Anterior fundal fibroid measures 4.4 x 4.0 x 3.7 cm. Posterior fundal fibroid measures 4.6 x 3.6 x 4.3 cm. This has shadowing calcifications. Right fundal fibroid measures 3.6 x 3.1 x 3.1 cm. The endometrium is not well-defined on the current exam due  to underlying fibroids which limits assessment for retained products. With tentatively represents the right ovary measures 6.0 x 4.4 x 6.4 cm. Complex cystic lesion within the structure measures 3.7 x 4.1 x 2.9 cm. Blood flow is demonstrated. Alternatively, this may represent a pedunculated fibroid. The left ovary is not seen. No adnexal mass. There is trace free fluid in the right lower quadrant. IMPRESSION: 1. No intrauterine gestational sac. Enlarged heterogeneous fibroid uterus. The endometrium is not well-defined due to underlying fibroids which limits assessment for retained products. 2. Complex right adnexal structure measuring 6.4 cm with a cystic component. This likely represents the right ovary with a complex cyst, however the possibility of pedunculated fibroid is also raised. Recommend correlation with any prior exams if available. Trending of beta hCG is also recommended. Electronically Signed   By: Keith Rake M.D.   On: 07/12/2020 19:50   CT ABDOMEN PELVIS W CONTRAST  Result Date: 07/12/2020 CLINICAL DATA:  Right lower quadrant abdominal pain EXAM: CT ABDOMEN AND PELVIS WITH CONTRAST . Reproductive: There is a heterogeneously hypodense mass within the right adnexa draining to the right ovarian vein likely representing an enlarged right ovary measuring 6.0 x 4.6 cm in size. There is trace surrounding mesenteric infiltration in review of the prior sonogram performed at 7:12 p.m., there is confirmed arterial flow, however, no definite venous flow is identified and this could reflect either  early or intermittent torsion of the right ovary. Less likely, this could reflect a torsed broad ligament fibroid. Multiple uterine fibroids are identified within an enlarged uterus. The left ovary is unremarkable, best seen on coronal imaging. Other: Rectum unremarkable Musculoskeletal: No acute bone abnormality.   IMPRESSION: 1. Heterogeneously hypodense mass within the right adnexa draining to the right ovarian vein, likely representing an enlarged right ovary measuring 6.0 x 4.6 cm in size. There is trace surrounding mesenteric infiltration. This most likely represents either early or intermittent torsion of the right ovary. Less likely, this could reflect a torsed broad ligament fibroid. Gynecologic consultation is recommended. 2. Enlarged fibroid uterus. 3. Compression of the common iliac veins at the confluence of the inferior vena cava, likely related to mass effect from the enlarged uterus.   PELVIC US: The uterus is enlarged and heterogeneous measuring 16.8 x 7.7 x 11.9 cm. There multiple uterine fibroids, some of which are shadowing/calcified. Anterior fundal fibroid measures 4.4 x 4.0 x 3.7 cm. Posterior fundal fibroid measures 4.6 x 3.6 x 4.3 cm. This has shadowing calcifications. Right fundal fibroid measures 3.6 x 3.1 x 3.1 cm. The endometrium is not well-defined on the current exam due to underlying fibroids which limits assessment for retained products.  With tentatively represents the right ovary measures 6.0 x 4.4 x 6.4 cm. Complex cystic lesion within the structure measures 3.7 x 4.1 x 2.9 cm. Blood flow is demonstrated. Alternatively, this may represent a pedunculated fibroid. The left ovary is not seen. No adnexal mass. There is trace free fluid in the right lower quadrant.  IMPRESSION: 1. No intrauterine gestational sac. Enlarged heterogeneous fibroid uterus. The endometrium is not well-defined due to underlying fibroids which limits assessment for retained products. 2. Complex  right adnexal structure measuring 6.4 cm with a cystic component. This likely represents the right ovary with a complex cyst, however the possibility of pedunculated fibroid is also raised. Recommend correlation with any prior exams if available.Trending of beta hCG is also recommended.    ASSESSMENT: 80DX [id number] admitted for pelvic pain- concern for right ovarian cyst  with possible intermittent torsion  PLAN: -PT is currently not experiencing pain- no evidence of acute ovarian torsion.  Overnight plan to keep patient NPO and to re-evaluate in the am.  Possible consideration for surgical intervention.  Discussed potential removal of ovary and pt was amenable as she does not desire a future pregnancy. -NPO -LR @ 100cc/hr -IV Dilaudid if needed overnight -labs stable as above  Janyth Pupa, DO [phone] (cell) [phone] (office)

## 2020-07-12 NOTE — ED Triage Notes (Signed)
Pt presents with lower abdominal pain. States pain is on right side xs 4 days. States the pain is worst after eating.

## 2020-07-12 NOTE — ED Triage Notes (Signed)
Pt sent here from urgent care for appendicitis rule out. Pt with RLQ abdominal pain since Thursday, worse right after eating. Denies n/v/d.

## 2020-07-12 NOTE — ED Provider Notes (Signed)
Jessica Maxwell   CSN: [id number] Arrival date & time: 07/12/20  1420     History Chief Complaint  Patient presents with  . Abdominal Pain    Jessica Maxwell is a 44 y.o. female.  The history is provided by the patient and medical records. No language interpreter was used.  Abdominal Pain Pain location:  RLQ Pain quality: aching and sharp   Pain radiates to:  Does not radiate Pain severity:  Severe Onset quality:  Gradual Duration:  4 days Timing:  Intermittent Progression:  Waxing and waning Chronicity:  New Context: not trauma   Relieved by:  Nothing Worsened by:  Eating Ineffective treatments:  None tried Associated symptoms: no chest pain, no chills, no cough, no diarrhea, no dysuria, no fatigue, no fever, no nausea, no shortness of breath, no vaginal bleeding, no vaginal discharge and no vomiting   Risk factors: multiple surgeries   Risk factors: not pregnant (recent miscarriage)        Past Medical History:  Diagnosis Date  . Fatigue   . Hypothyroid   . Hypothyroidism following radioiodine therapy   . Menometrorrhagia   . Secondary oligomenorrhea   . Thyroiditis, autoimmune   . Thyrotoxicosis with diffuse goiter   . Toxic diffuse goiter with exophthalmos     Patient Active Problem List   Diagnosis Date Noted  . Obesity 04/13/2017  . History of multiple miscarriages 01/05/2017  . High-risk pregnancy 10/23/2016  . 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 Other malaise and fatigue 02/19/2013  . Excessive caffeine abuse, continuous ([place]) 08/07/2012  . Thyrotoxicosis with diffuse goiter   . Hypothyroidism following radioiodine therapy   . Toxic diffuse goiter with exophthalmos   . Thyroiditis, autoimmune     Past Surgical History:  Procedure Laterality Date  . ECTOPIC PREGNANCY SURGERY       OB History    Gravida  3   Para      Term      Preterm      AB  3   Living  0     SAB      TAB    2   Ectopic  1   Multiple      Live Births              Family History  Problem Relation Age of Onset  . Heart disease Father   . Cancer Father        small bowel cancer   . Thyroid disease Maternal Aunt        hypothyroid  . Heart disease Maternal Aunt   . Heart disease Paternal Aunt   . Thyroid disease Paternal Grandfather        hyperthyroid  . Heart disease Paternal Grandfather   . Diabetes Neg Hx     Social History   Tobacco Use  . Smoking status: Never Smoker  . Smokeless tobacco: Never Used  Substance Use Topics  . Alcohol use: No  . Drug use: No    Home Medications Prior to Admission medications   Medication Sig Start Date End Date Taking? Authorizing Provider  lisinopril (ZESTRIL) 2.5 MG tablet Take 1 tablet (2.5 mg total) by mouth daily. 03/23/20   Sherrlyn Hock, MD  loratadine (CLARITIN) 10 MG tablet Take 10 mg by mouth daily.    [provider]  ranitidine (ZANTAC) 150 MG tablet Take 1 tablet (150 mg total) by mouth 2 (two) times daily. Patient not  taking: Reported on 11/05/2018 04/13/17   Sherrlyn Hock, MD  SYNTHROID 100 MCG tablet Take 1 tablet (170mcg) by mouth 6 days a week. Take 1/2 tablet (77mcg) 1 day a week 05/17/20   Sherrlyn Hock, MD  Vitamin D, Ergocalciferol, (DRISDOL) 50000 units CAPS capsule Take 50,000 Units by mouth every 7 (seven) days.    [provider]    Allergies    Tapazole [methimazole]  Review of Systems   Review of Systems  Constitutional: Negative for chills, fatigue and fever.  HENT: Negative for dental problem.   Respiratory: Negative for cough, chest tightness and shortness of breath.   Cardiovascular: Negative for chest pain.  Gastrointestinal: Positive for abdominal pain. Negative for abdominal distention, diarrhea, nausea and vomiting.  Genitourinary: Negative for dysuria, flank pain, frequency, vaginal bleeding and vaginal discharge.  Musculoskeletal: Negative for back pain.  Skin:  Negative for rash.  Neurological: Negative for headaches.  Psychiatric/Behavioral: Negative for agitation.  All other systems reviewed and are negative.   Physical Exam Updated Vital Signs BP (!) 131/93 (BP Location: Left Arm)   Pulse 76   Temp 98.4 F (36.9 C) (Oral)   Resp 17   SpO2 100%   Physical Exam Vitals and nursing Maxwell reviewed.  Constitutional:      General: She is not in acute distress.    Appearance: She is well-developed. She is not ill-appearing, toxic-appearing or diaphoretic.  HENT:     Head: Normocephalic and atraumatic.  Eyes:     Conjunctiva/sclera: Conjunctivae normal.  Cardiovascular:     Rate and Rhythm: Normal rate and regular rhythm.     Heart sounds: Normal heart sounds. No murmur heard.   Pulmonary:     Effort: Pulmonary effort is normal. No respiratory distress.     Breath sounds: Normal breath sounds.  Abdominal:     General: Abdomen is flat. Bowel sounds are normal. There is no distension.     Palpations: Abdomen is soft.     Tenderness: There is abdominal tenderness in the right lower quadrant. There is no right CVA tenderness or left CVA tenderness.  Musculoskeletal:     Cervical back: Neck supple.  Skin:    General: Skin is warm and dry.     Capillary Refill: Capillary refill takes less than 2 seconds.  Neurological:     General: No focal deficit present.     Mental Status: She is alert.  Psychiatric:        Mood and Affect: Mood normal.     ED Results / Procedures / Treatments   Labs (all labs ordered are listed, but only abnormal results are displayed) Labs Reviewed  COMPREHENSIVE METABOLIC PANEL - Abnormal; Notable for the following components:      Result Value   Total Bilirubin <0.1 (*)    All other components within normal limits  CBC - Abnormal; Notable for the following components:   WBC 11.0 (*)    All other components within normal limits  I-STAT BETA HCG BLOOD, ED ([facility], [facility], [facility] ONLY) - Abnormal; Notable for the  following components:   I-stat hCG, quantitative 443.5 (*)    All other components within normal limits  SARS CORONAVIRUS 2 BY RT PCR (HOSPITAL ORDER, [facility])  LIPASE, BLOOD    EKG None  Radiology US OB Comp < 14 Wks  Result Date: 07/12/2020 CLINICAL DATA:  Known miscarriage. Right lower quadrant pain. Beta HCG 443 EXAM: OBSTETRIC <14 WK ULTRASOUND TECHNIQUE:  Transabdominal ultrasound was performed for evaluation of the gestation as well as the maternal uterus and adnexal regions. COMPARISON:  No prior obstetric exams available. FINDINGS: Intrauterine gestational sac: None Yolk sac:  Not Visualized. Embryo:  Not Visualized. Cardiac Activity: Not Visualized. Maternal uterus/adnexae: The uterus is enlarged and heterogeneous measuring 16.8 x 7.7 x 11.9 cm. There multiple uterine fibroids, some of which are shadowing/calcified. Anterior fundal fibroid measures 4.4 x 4.0 x 3.7 cm. Posterior fundal fibroid measures 4.6 x 3.6 x 4.3 cm. This has shadowing calcifications. Right fundal fibroid measures 3.6 x 3.1 x 3.1 cm. The endometrium is not well-defined on the current exam due to underlying fibroids which limits assessment for retained products. With tentatively represents the right ovary measures 6.0 x 4.4 x 6.4 cm. Complex cystic lesion within the structure measures 3.7 x 4.1 x 2.9 cm. Blood flow is demonstrated. Alternatively, this may represent a pedunculated fibroid. The left ovary is not seen. No adnexal mass. There is trace free fluid in the right lower quadrant. IMPRESSION: 1. No intrauterine gestational sac. Enlarged heterogeneous fibroid uterus. The endometrium is not well-defined due to underlying fibroids which limits assessment for retained products. 2. Complex right adnexal structure measuring 6.4 cm with a cystic component. This likely represents the right ovary with a complex cyst, however the possibility of pedunculated fibroid is also raised. Recommend  correlation with any prior exams if available. Trending of beta hCG is also recommended. Electronically Signed   By: Keith Rake M.D.   On: 07/12/2020 19:50   CT ABDOMEN PELVIS W CONTRAST  Result Date: 07/12/2020 CLINICAL DATA:  Right lower quadrant abdominal pain EXAM: CT ABDOMEN AND PELVIS WITH CONTRAST TECHNIQUE: Multidetector CT imaging of the abdomen and pelvis was performed using the standard protocol following bolus administration of intravenous contrast. CONTRAST:  162mL OMNIPAQUE IOHEXOL 300 MG/ML  SOLN COMPARISON:  None. FINDINGS: Lower chest: The visualized lung bases are clear bilaterally. The visualized heart and pericardium are unremarkable. Hepatobiliary: Tiny cyst within the left hepatic lobe. Liver and gallbladder are otherwise unremarkable. No intra or extrahepatic biliary ductal dilation. Pancreas: Unremarkable Spleen: Unremarkable Adrenals/Urinary Tract: Adrenal glands are unremarkable. Kidneys are normal, without renal calculi, focal lesion, or hydronephrosis. Bladder is unremarkable. Stomach/Bowel: Stomach, small bowel, and large bowel are unremarkable save for minimal Vascular/Lymphatic: The abdominal vasculature is notable for compression of the common iliac veins at the confluence of the inferior vena cava likely related to mass effect from the enlarged uterus. Reproductive: There is a heterogeneously hypodense mass within the right adnexa draining to the right ovarian vein likely representing an enlarged right ovary measuring 6.0 x 4.6 cm in size. There is trace surrounding mesenteric infiltration in review of the prior sonogram performed at 7:12 p.m., there is confirmed arterial flow, however, no definite venous flow is identified and this could reflect either early or intermittent torsion of the right ovary. Less likely, this could reflect a torsed broad ligament fibroid. Multiple uterine fibroids are identified within an enlarged uterus. The left ovary is unremarkable, best seen  on coronal imaging. Other: Rectum unremarkable Musculoskeletal: No acute bone abnormality. IMPRESSION: 1. Heterogeneously hypodense mass within the right adnexa draining to the right ovarian vein, likely representing an enlarged right ovary measuring 6.0 x 4.6 cm in size. There is trace surrounding mesenteric infiltration. This most likely represents either early or intermittent torsion of the right ovary. Less likely, this could reflect a torsed broad ligament fibroid. Gynecologic consultation is recommended. 2. Enlarged fibroid uterus. 3. Compression  of the common iliac veins at the confluence of the inferior vena cava, likely related to mass effect from the enlarged uterus. 4. These results were called by telephone at the time of interpretation on 07/12/2020 at 10:37 pm to provider [facility] who verbally acknowledged these results. Electronically Signed   By: Fidela Salisbury MD   On: 07/12/2020 22:38   US Abdomen Limited  Result Date: 07/12/2020 CLINICAL DATA:  Right lower quadrant pain.  No known miscarriage. EXAM: ULTRASOUND ABDOMEN LIMITED TECHNIQUE: Pearline Cables scale imaging of the right lower quadrant was performed to evaluate for suspected appendicitis. Standard imaging planes and graded compression technique were utilized. COMPARISON:  None. FINDINGS: The appendix is not visualized. Ancillary findings: Small amount of pelvic free fluid adjacent to the right ovary. Factors affecting image quality: None. Other findings: Tenderness to transducer pressure in the right lower quadrant. IMPRESSION: Non visualization of the appendix. Non-visualization of appendix by [place] does not definitely exclude appendicitis. If there is sufficient clinical concern, consider abdomen pelvis CT with contrast for further evaluation. Electronically Signed   By: Lajean Manes M.D.   On: 07/12/2020 19:45    Procedures Procedures (including critical care time)  Medications Ordered in ED Medications    HYDROcodone-acetaminophen (NORCO/VICODIN) 5-325 MG per tablet 1 tablet (1 tablet Oral Given 07/12/20 1857)  iohexol (OMNIPAQUE) 300 MG/ML solution 100 mL (100 mLs Intravenous Contrast Given 07/12/20 2146)    ED Course  I have reviewed the triage vital signs and the nursing notes.  Pertinent labs & imaging results that were available during my care of the patient were reviewed by me and considered in my medical decision making (see chart for details).    MDM Rules/Calculators/A&P                          Jessica Maxwell is a 44 y.o. female with a past medical history significant for hypothyroidism, prior ectopic pregnancy surgery on the left side, multiple miscarriages, and current elevated hCG monitoring for miscarriage who presents for right lower quadrant abdominal pain.  Patient reports that for the last several days, she has had severe right lower quadrant abdominal pain.  She reports he gets up to a 12 out of 10 in severity.  She reports that when she eats it does seem to worsen.  She reports he had a normal bowel movement today.  She denies any pelvic symptoms such as vaginal discharge or vaginal bleeding.  She denies any chest pain, shortness of breath or lightheadedness.  No nausea or vomiting.  She denies fevers or chills.  No recent viral syndromes.  Patient saw urgent care today where they were concerned about appendicitis.  Patient was sent here for evaluation.  On exam, patient does have right lower quadrant abdominal tenderness.  Patient had imaging obtained in triage including a pelvic ultrasound had abdominal ultrasound.  Appendix was not visualized and they saw a 6 cm ovary on the right side that appeared to have arterial flow.  I saw the patient and she did have right lower quadrant tenderness.  Normal bowel sounds.  Lungs clear.  Patient agreed to get a CT scan to look for appendicitis given the high concern.  CT scan did not show appendicitis and had a normal appendix.  The  radiologist was very concerned about the ovary causing her symptoms.  He reviewed the ultrasound from several hours ago and he is concerned about intermittent torsion due to the  lack of good venous flow from the ovary.  I called the patient's OB/GYN team and they wanted to admit her for observation and further assessment to discuss if she needs surgery to remove the ovary.  Patient agrees with plan of care and will be tested for Covid and will remain n.p.o.  Patient admitted for further management.           Final Clinical Impression(s) / ED Diagnoses Final diagnoses:  RLQ abdominal pain     Clinical Impression: 1. RLQ abdominal pain     Disposition: Admit  This Maxwell was prepared with assistance of Dragon voice recognition software. Occasional wrong-word or sound-a-like substitutions may have occurred due to the inherent limitations of voice recognition software.      Kattleya Kuhnert, Gwenyth Allegra, MD 07/13/20 [phone]

## 2020-07-12 NOTE — Discharge Instructions (Signed)
44 year old female comes in for 4-day history of right lower quadrant pain that is worsening.  Pain is intermittent, worse with eating.  Denies radiation.  Denies nausea, vomiting.  Denies diarrhea, constipation.  Had BM recently without improvement of symptoms.  Denies fever, chills, body aches.  Denies urinary changes.  Had miscarriage 2 months ago, and still tracking hCG weekly per GYN, last check 3 days ago, still slightly elevated.  BP 136/87 RR 20 HR 82 O2 100% Temp 98.2 Constitutional: nontoxic, no acute distress Heart: RRR Lungs: LCTAB Abd: soft, +BS, RLQ tenderness with mild guarding, no rebound. Negative Rovsing's, psoas, obturator. Neuro: alert and oriented x 4  Given history and exam, discussed unable to rule out appendicitis.  Will need further evaluation at the ED.  Discharged in stable condition to the ED for further evaluation.

## 2020-07-12 NOTE — ED Provider Notes (Signed)
44 year old female comes in for 4-day history of right lower quadrant pain that is worsening.  Pain is intermittent, worse with eating.  Denies radiation.  Denies nausea, vomiting.  Denies diarrhea, constipation.  Had BM recently without improvement of symptoms.  Denies fever, chills, body aches.  Denies urinary changes.  Had miscarriage 2 months ago, and still tracking hCG weekly per GYN, last check 3 days ago, still slightly elevated.  BP 136/87 RR 20 HR 82 O2 100% Temp 98.2 Constitutional: nontoxic, no acute distress Heart: RRR Lungs: LCTAB Abd: soft, +BS, RLQ tenderness with mild guarding, no rebound. Negative Rovsing's, psoas, obturator. Neuro: alert and oriented x 4  Urine dipstick: negative leuks, nitrites Urine preg: positive-- patient had miscarriage, still tracking HCG, known elevation that is monitored by GYN  Given history and exam, discussed unable to rule out appendicitis.  Will need further evaluation at the ED.  Discharged in stable condition to the ED for further evaluation.    Ok Edwards, PA-C 07/12/20 1414

## 2020-07-13 ENCOUNTER — Encounter (INDEPENDENT_AMBULATORY_CARE_PROVIDER_SITE_OTHER): Payer: Self-pay | Admitting: *Deleted

## 2020-07-13 DIAGNOSIS — R109 Unspecified abdominal pain: Secondary | ICD-10-CM | POA: Diagnosis not present

## 2020-07-13 LAB — SARS CORONAVIRUS 2 BY RT PCR (HOSPITAL ORDER, PERFORMED IN ~~LOC~~ HOSPITAL LAB): SARS Coronavirus 2: NEGATIVE

## 2020-07-13 MED ORDER — LACTATED RINGERS IV SOLN: INTRAVENOUS | Status: DC

## 2020-07-13 MED ORDER — HYDROMORPHONE HCL 1 MG/ML IJ SOLN: 0.5000 mg | INTRAMUSCULAR | Status: DC | PRN

## 2020-07-13 NOTE — Discharge Summary (Signed)
Physician Discharge Summary  Patient ID: Jessica Maxwell MRN: [id number] DOB/AGE: November 02, 1976 44 y.o.  Admit date: 07/12/2020 Discharge date: 07/13/2020  Admission Diagnoses: Abdominal pain  Discharge Diagnoses:  Active Problems:   Abdominal pain   Discharged Condition: good  Hospital Course: Patient admitted for severe acute abdominal pain and large ovarian cyst on right side. Concern for torsion present. Overnight patient observed, IV hydration and prn IV pain medication given.  On HD#1 patient with marked improvement of pain, Discussed with patient that it may have been acute rupture of cyst versus hemorrhagic cyst bleeding secondary to early missed ab.  Patient discharged home with close follow up in office  Consults: None  Significant Diagnostic Studies: labs: hb 11.5 Radiology:     CT SCAN IMPRESSION: 1. Heterogeneously hypodense mass within the right adnexa draining to the right ovarian vein, likely representing an enlarged right ovary measuring 6.0 x 4.6 cm in size. There is trace surrounding mesenteric infiltration. This most likely represents either early or intermittent torsion of the right ovary. Less likely, this could reflect a torsed broad ligament fibroid. Gynecologic consultation is recommended. 2. Enlarged fibroid uterus. 3. Compression of the common iliac veins at the confluence of the inferior vena cava, likely related to mass effect from the enlarged uterus.  PELVIC US IMPRESSION: 1. No intrauterine gestational sac. Enlarged heterogeneous fibroid uterus. The endometrium is not well-defined due to underlying fibroids which limits assessment for retained products. 2. Complex right adnexal structure measuring 6.4 cm with a cystic component. This likely represents the right ovary with a complex cyst, however the possibility of pedunculated fibroid is also raised. Recommend correlation with any prior exams if available. Trending of beta hCG is also  recommended.   Treatments: IV hydration and analgesia: Dilaudid  Discharge Exam: Blood pressure 105/71, pulse 89, temperature 98.6 F (37 C), temperature source Oral, resp. rate 17, SpO2 98 %. General appearance: alert, cooperative and no distress GI:  findings:  Soft, tender to palpation right lower quadrant, no rebound or guarding, no peritoneal signs.  Pelvic: deferred Extremities: extremities normal, atraumatic, no cyanosis or edema  Disposition: Discharge disposition: 01-Home or Self Care       Discharge Instructions    Call MD for:   Complete by: As directed    Heavy vaginal bleeding (soaking a pad an hour)   Call MD for:  difficulty breathing, headache or visual disturbances   Complete by: As directed    Call MD for:  extreme fatigue   Complete by: As directed    Call MD for:  hives   Complete by: As directed    Call MD for:  persistant dizziness or light-headedness   Complete by: As directed    Call MD for:  persistant nausea and vomiting   Complete by: As directed    Call MD for:  severe uncontrolled pain   Complete by: As directed    Call MD for:  temperature >100.4   Complete by: As directed    Diet - low sodium heart healthy   Complete by: As directed    Discharge instructions   Complete by: As directed    Call [facility] office [phone] if  you have any questions or concerns.  Make an appointment to see Dr. Mancel Bale in 1-2 weeks   Increase activity slowly   Complete by: As directed      Allergies as of 07/13/2020      Reactions   Tapazole [methimazole] Hives  Medication List    TAKE these medications   lisinopril 2.5 MG tablet Commonly known as: ZESTRIL Take 1 tablet (2.5 mg total) by mouth daily.   Synthroid 100 MCG tablet Generic drug: levothyroxine Take 1 tablet (182mcg) by mouth 6 days a week. Take 1/2 tablet (19mcg) 1 day a week What changed:   how much to take  how to take this  when to take this  additional  instructions   Vitamin D (Ergocalciferol) 1.25 MG (50000 UNIT) Caps capsule Commonly known as: DRISDOL Take 50,000 Units by mouth every Sunday.        SignedSanjuana Kava 07/13/2020, 2:39 PM

## 2020-07-13 NOTE — Progress Notes (Signed)
MD PROGRESS NOTE  Subjective: Patient is s/p overnight admission for right lower quadrant pain.  Patient notes that her pain has improved significantly.  She has eaten today, regular diet and denies post prandial pain.  She denies nausea or vomiting, no fever or chills.  She does note some vaginal bleeding, not heavy, no passage of clots.   Objective: I have reviewed patient's vital signs, medications and labs.  General: alert, cooperative and no distress GI: soft,; no masses,  no organomegaly and abnormal findings: mild tenderness to palpation right lower quadrant pain, no rebound or guarding.  Vaginal Bleeding: minimal   Assessment/Plan: 44 year old with history of acute right lower quadrant pain, evidence of large corpus luteal cyst on right side, secondary to early missed ab.  Quantitative HCG is decreasing appropriately c/w miscarriage.  Patient with resolution of severe abdominal pain.  Discussed with patient:   -follow up in office with Dr. Mancel Bale in 1-2 weeks. She is scheduled for repeat quant this Friday in office.  -Ovarian torsion precautions explained.  -Appendicitis precautions explained.  -Patient cautioned to return to hospital if any emergent clinical signs occur (nausea, vomiting, intractable pain, fever, chills, etc) -Patient to take over the counter NSAIDS prn pain.  -Will discharge patient home.    LOS: 0 days    [facility] 07/13/2020, 2:31 PM

## 2020-08-25 DIAGNOSIS — Z1231 Encounter for screening mammogram for malignant neoplasm of breast: Secondary | ICD-10-CM | POA: Diagnosis not present

## 2020-08-25 DIAGNOSIS — Z01419 Encounter for gynecological examination (general) (routine) without abnormal findings: Secondary | ICD-10-CM | POA: Diagnosis not present

## 2020-08-25 DIAGNOSIS — Z113 Encounter for screening for infections with a predominantly sexual mode of transmission: Secondary | ICD-10-CM | POA: Diagnosis not present

## 2020-08-25 DIAGNOSIS — Z139 Encounter for screening, unspecified: Secondary | ICD-10-CM | POA: Diagnosis not present

## 2020-08-25 DIAGNOSIS — Z124 Encounter for screening for malignant neoplasm of cervix: Secondary | ICD-10-CM | POA: Diagnosis not present

## 2020-08-25 DIAGNOSIS — Z6830 Body mass index (BMI) 30.0-30.9, adult: Secondary | ICD-10-CM | POA: Diagnosis not present

## 2020-09-09 ENCOUNTER — Telehealth (INDEPENDENT_AMBULATORY_CARE_PROVIDER_SITE_OTHER): Payer: Self-pay | Admitting: "Endocrinology

## 2020-09-09 DIAGNOSIS — E890 Postprocedural hypothyroidism: Secondary | ICD-10-CM

## 2020-09-09 MED ORDER — SYNTHROID 100 MCG PO TABS: 100.0000 ug | ORAL_TABLET | Freq: Every day | ORAL | 1 refills | Status: DC

## 2020-09-09 NOTE — Telephone Encounter (Signed)
Per conversation with Vikki Ports and Dr Alfonse Spruce last note its ok to fill the 100 mcg of levothyroxine.

## 2020-09-09 NOTE — Telephone Encounter (Signed)
Who's calling (name and relationship to patient) : Aanchal (self)  Best contact number: [phone]  Provider they see: Dr. Tobe Sos  Reason for call:  Neal called in stating she needed a new RX sent over for her Synthroid. States the dosing changed and she is completely out. Pharmacy will not fill it early. Please advise  Call ID:      PRESCRIPTION REFILL ONLY  Name of prescription: Synthroid   Pharmacy:  [facility]

## 2020-09-10 ENCOUNTER — Telehealth (INDEPENDENT_AMBULATORY_CARE_PROVIDER_SITE_OTHER): Payer: Self-pay | Admitting: "Endocrinology

## 2020-09-10 ENCOUNTER — Other Ambulatory Visit (INDEPENDENT_AMBULATORY_CARE_PROVIDER_SITE_OTHER): Payer: Self-pay

## 2020-09-10 DIAGNOSIS — E890 Postprocedural hypothyroidism: Secondary | ICD-10-CM

## 2020-09-10 DIAGNOSIS — E559 Vitamin D deficiency, unspecified: Secondary | ICD-10-CM

## 2020-09-10 NOTE — Telephone Encounter (Signed)
  Who's calling (name and relationship to patient) : Adelena ( self)  Best contact number: [phone]  Provider they see: Dr. Tobe Sos  Reason for call: [facility] will be [facility] after work to do her lab work before her appt on Monday. She needs to have those labs released to [facility] please     [place]  Name of prescription:  Pharmacy:

## 2020-09-13 ENCOUNTER — Other Ambulatory Visit: Payer: Self-pay

## 2020-09-13 ENCOUNTER — Encounter (INDEPENDENT_AMBULATORY_CARE_PROVIDER_SITE_OTHER): Payer: Self-pay | Admitting: "Endocrinology

## 2020-09-13 ENCOUNTER — Ambulatory Visit (INDEPENDENT_AMBULATORY_CARE_PROVIDER_SITE_OTHER): Payer: BC Managed Care – PPO | Admitting: "Endocrinology

## 2020-09-13 VITALS — BP 132/90 | HR 82 | Wt 166.4 lb

## 2020-09-13 DIAGNOSIS — E559 Vitamin D deficiency, unspecified: Secondary | ICD-10-CM

## 2020-09-13 DIAGNOSIS — E063 Autoimmune thyroiditis: Secondary | ICD-10-CM

## 2020-09-13 DIAGNOSIS — E663 Overweight: Secondary | ICD-10-CM | POA: Diagnosis not present

## 2020-09-13 DIAGNOSIS — E890 Postprocedural hypothyroidism: Secondary | ICD-10-CM

## 2020-09-13 DIAGNOSIS — E0500 Thyrotoxicosis with diffuse goiter without thyrotoxic crisis or storm: Secondary | ICD-10-CM

## 2020-09-13 DIAGNOSIS — E349 Endocrine disorder, unspecified: Secondary | ICD-10-CM

## 2020-09-13 DIAGNOSIS — H0520 Unspecified exophthalmos: Secondary | ICD-10-CM

## 2020-09-13 DIAGNOSIS — R1013 Epigastric pain: Secondary | ICD-10-CM

## 2020-09-13 LAB — T3, FREE: T3, Free: 2.7 pg/mL (ref 2.3–4.2)

## 2020-09-13 LAB — PTH, INTACT AND CALCIUM
Calcium: 9.6 mg/dL (ref 8.6–10.2)
PTH: 45 pg/mL (ref 14–64)

## 2020-09-13 LAB — TSH: TSH: 0.89 mIU/L

## 2020-09-13 LAB — VITAMIN D 25 HYDROXY (VIT D DEFICIENCY, FRACTURES): Vit D, 25-Hydroxy: 53 ng/mL (ref 30–100)

## 2020-09-13 LAB — T4, FREE: Free T4: 1.3 ng/dL (ref 0.8–1.8)

## 2020-09-13 NOTE — Patient Instructions (Signed)
Follow up visit in 6 months. Please repeat lab tests 1-2 weeks prior.  

## 2020-09-13 NOTE — Progress Notes (Addendum)
Subjective:  Patient Name: Jessica Maxwell Date of Birth: 05-Jun-1976  MRN: [id number]  Jessica Maxwell  presents for her MyChart visit today for follow-up of her hypothyroidism s/p I-131 therapy for Graves' disease, exophthalmos, goiter, Hashimoto's thyroiditis, fatigue, dyspepsia, hirsutism, hypocalcemia, vitamin D deficiency, and secondary hyperparathyroidism.  HISTORY OF PRESENT ILLNESS:   Jessica Maxwell is a 44 y.o. African-American woman.  [name] was unaccompanied.  1. The patient was referred to me on 11/07/2005 by Dr. Milagros Evener of the [facility] for evaluation and management of  thyrotoxicosis caused by Graves' disease, exophthalmos, weight loss, tachycardia, and tremor. The patient was then 44 years old.  A. The patient had begun to develop symptoms and signs of thyrotoxicosis at age 92-19. By the Summer of  2006 she had begun to miss menstrual periods. She saw Dr. Radene Ou in October 2006. Dr. Radene Ou diagnosed Graves disease clinically and ordered lab tests. On 08/28/05 the TSH was 0.022. Free T4 was 6.28. Dr. Radene Ou started the patient on methimazole, which reduced the free T4 to 1.6 by 09/19/05, but the patient developed an extensive rash on day 21 of therapy, so Dr. Radene Ou appropriately discontinued the medication on 09/22/05.  Thyroid US on 09/04/05 showed a 10x16 mm nodule in the right superior pole. Echotexture in both lobes was inhomogeneous. Radioactive iodine uptake on 10/06/05 was 79% (normal 15-30%). The previously identified "nodule" had avid uptake, similar to the uptake in the remainder of the thyroid gland. The radiologic impression was: "findings compatible with diffuse toxic goiter". Dr. Radene Ou also treated her with atenolol, 50 mg/day.  B. At the time the patient first saw me, she was quite hyperthyroid again. Her past medical history was remarkable only for a unilateral salpingo-oophorectomy for an ectopic pregnancy. Her family history was positive for a paternal  grandfather who had been diagnosed with Graves' disease and exophthalmos and a maternal aunt who had been diagnosed with hypothyroidism.  C. On physical exam, her weight was 125 pounds, her BP was 130/88. Her heart rate was 96. Her eyes were very prominent and she had both inferior proptosis and stare. She had 1+ tongue tremor and 2+ carotid/thyroid bruits. Her thyroid gland was 30-35 grams in size and had a lobulated, firm consistency. She had a grade III/VI systolic flow murmur. She also had 1+ tremor of her outstretched fingers. TSH was.020. Free T4 was 5.0. T3 was 816 (normal 60-181). TPO antibody was 3,873.2 (normal <40-60). TSI was 3.1 (normal < 1.3). Alkaline phosphatase was 179 (normal 39-117), consistent with increased bone turnover due to hyperthyroidism.   D. The patient had a combination of both Graves' Disease with exophthalmos and Hashimoto's Thyroiditis. Her Graves' disease was clearly dominant. Due to her allergic reaction to methimazole, it was quite likely that she might have a similar reaction to propylthiouracil (PTU). I discussed the advantages and disadvantages with her of PTU therapy, I-131 therapy, and thyroidectomy. I recommended I-131 therapy and she concurred. On 01/26/06 she was treated with 12.55 mCi of I-131. Unfortunately, that dose did not destroy enough thyrocytes so she required a second dose of 26.3 mCi of I-131 on 10/12/06.  By 0/18/08 she was hypothyroid, with a TSH of 19.9, free T4 of 0.46, and free T3 of 1.2. I started her on a daily dose of 88 mcg of Synthroid at that time.   2. Clinical course: During the last 14 years we have worked with Jessica Maxwell about several issues. For a detailed discussion of these issues see my encounter note from 12/21/17:  A. Jessica Maxwell's thyroid hormone levels have fluctuated based upon flare ups of her Graves disease activity, flare ups of her Hashimoto's disease activity, her compliance with taking Synthroid, and her absorption of the Synthroid. As a  result we have adjusted her Synthroid doses many time.  B. Her Graves' disease exophthalmos has improved over time, but persists. When her allergies act up she has more conjunctival erythema and irritation than a person without exophthalmos would have.   C. She has had problems several times with secondary hyperparathyroidism due to vitamin D deficiency and hypocalcemia due to inadequate calcium intake.   D. She has gradually gained more weight over time, become mildly obese, and developed dyspepsia.   E. She has her covid vaccinations.   3. The patient's last [facility] televisit was on 03/23/20. At that visit I continued her Synthroid dose of 100 mcg/day for 6 days per week. However, after seeing her lab results in July 2021, she increased the dose to one pill every day. I asked her to take two Citracal tablets of 500-600 mg strength per week. I also continued her Biotech once weekly. I also started her on lisinopril, 2.5 mg/day, but she decided not to take it because her BPs are always good everywhere but here.    A. In the interim she had been healthy until 07/12/20 when she was admitted to the [facility] for acute abdominal pain in the setting of a large right ovarian cyst. She was noted on CT scan to have a large fibroid uterus and a heterogeneous hypodense right adnexal mass. Her hCG was positive for pregnancy. Her course was c/w an early missed spontaneous abortion or a ruptured ovarian cyst.   B. Her allergies have still been "terrible". She does not take any allergy medications.   C. She is back in school at [employer] for nursing. She will start her clinical rotations in the Spring. She is also still working at [employer].  D. She is busy, but has not been exercising much.    E. Her weight is up to 166 pounds.  4. Review of Systems: She has been healthy.  Constitutional: She feels "okay". Energy level is "good". She has not been tired. She is not jittery or hyper anymore. She sleeps well, but no longer dreams a lot.    Eyes: Eyes are about the same. She wears her glasses all the time now. She feels that her left eye is a bit more pronounced than the right eye. She has no complaints of ocular pressure with "up and out" gaze in either eye. There are no significant eye complaints.  Neck: She has no complaints of anterior neck swelling, soreness, tenderness,  pressure, discomfort, or difficulty swallowing.  Heart: Heart rate increases with exercise or other physical activity. She has no complaints of palpitations, irregular heat beats, chest pain, or chest pressure. Gastrointestinal: She has little belly hunger. Her carb intake has remained fairly stable.  The patient has no complaints of stomach aches or pains, diarrhea, or constipation. Hands: No problems Legs: Muscle mass and strength seem normal. She sometimes has  knee pains when she is walking down stairs. There are no complaints of numbness, tingling, burning, or pain. No edema is noted. Feet: There are no obvious foot problems. There are no complaints of numbness, tingling, burning, or pain. No edema is noted. GYN: LMP was in October 2021. She says she can't be pregnant.    PAST MEDICAL, FAMILY, AND SOCIAL HISTORY:  Past Medical History:  Diagnosis  Date  . Fatigue   . Hypothyroid   . Hypothyroidism following radioiodine therapy   . Menometrorrhagia   . Secondary oligomenorrhea   . Thyroiditis, autoimmune   . Thyrotoxicosis with diffuse goiter   . Toxic diffuse goiter with exophthalmos     Family History  Problem Relation Age of Onset  . Heart disease Father   . Cancer Father        small bowel cancer   . Thyroid disease Maternal Aunt        hypothyroid  . Heart disease Maternal Aunt   . Heart disease Paternal Aunt   . Thyroid disease Paternal Grandfather        hyperthyroid  . Heart disease Paternal Grandfather   . Diabetes Neg Hx      Current Outpatient Medications:  .  SYNTHROID 100 MCG tablet, Take 1 tablet (100 mcg total) by mouth  daily before breakfast., Disp: 30 tablet, Rfl: 1 .  Vitamin D, Ergocalciferol, (DRISDOL) 50000 units CAPS capsule, Take 50,000 Units by mouth every Sunday. , Disp: , Rfl:  .  lisinopril (ZESTRIL) 2.5 MG tablet, Take 1 tablet (2.5 mg total) by mouth daily. (Patient not taking: Reported on 07/13/2020), Disp: 30 tablet, Rfl: 11  Allergies as of 09/13/2020 - Review Complete 09/13/2020  Allergen Reaction Noted  . Tapazole [methimazole] Hives 04/20/2011    1. 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 She loves her job. She moved back to [place] and lives with her boyfriend. She attends [employer] for her BSN/RN degree.  2. Activities: She walks a lot at work.    3. Smoking, alcohol, or drugs: None 4. Primary Care Provider: Dr. Briscoe Deutscher at [facility] at [place], fax [phone] 5. OB-GYN: [facility] on [address]: There are no other significant problems involving Jessica Maxwell's other body systems.   Objective:  Vital Signs:  BP 132/90   Pulse 82   Wt 166 lb 6.4 oz (75.5 kg)   BMI 30.68 kg/m    Ht Readings from Last 3 Encounters:  07/13/20 5' 1.75" (1.568 m)  11/05/18 5' 2.01" (1.575 m)  04/26/18 5' 2.6" (1.59 m)   Wt Readings from Last 3 Encounters:  09/13/20 166 lb 6.4 oz (75.5 kg)  03/23/20 154 lb 12.8 oz (70.2 kg)  11/05/18 166 lb (75.3 kg)   HC Readings from Last 3 Encounters:  No data found for [facility]   Body surface area is 1.81 meters squared.  Facility age limit for growth percentiles is 20 years. Facility age limit for growth percentiles is 20 years.   Constitutional: The patient looks healthy and appears physically and emotionally well. She is alert and bright. Her affect and insight are normal. She looks happy. She has gained  12 pounds in 6 months.  Eyes: There is no arcus or proptosis.   Mouth: The oropharynx appears normal. The tongue appears normal. There is normal  oral moisture. There is no obvious gingivitis. Neck: There are no bruits present. The thyroid gland appears normal in size. The thyroid gland is normal, at approximately 18 grams in size. The consistency of the thyroid gland is normal. There is no thyroid tenderness to palpation. Lungs: The lungs are clear. Air movement is good. Heart: The heart rhythm and rate appear normal. Heart sounds S1 and S2 are normal. I do not appreciate any pathologic heart murmurs. Abdomen: The abdominal size is  mildly enlarged. Bowel sounds are normal. The abdomen is larger, soft, and non-tender. There is no obviously palpable hepatomegaly, splenomegaly, or other masses.  Arms: Muscle mass appears appropriate for age.  Hands: There is no obvious tremor. Phalangeal and metacarpophalangeal joints appear normal. Palms are normal. Legs: Muscle mass appears appropriate for age. There is no edema.  Neurologic: Muscle strength is normal for age and gender  in both the upper and the lower extremities. Muscle tone appears normal. Sensation to touch is normal in the legs.    LAB DATA:  Labs 09/10/20: TSH 0.89, free T4 1.3, free T3  2.7; 25-OH vitamin D 53  Labs 07/12/20: CMP normal,; CBC normal, except WBC 11,000; hCG 443.5 (ref <5)  Labs 06/23/20: TSH 1.15, free T4 1.3, free T3 2.8, TSI <89; PTH 53, calcium 9.6, 25-OH vitamin D 40  Labs 05/14/20: TSH 5.86, free T4 1.1, free T3 2.2; PTH 40 (ref 14-64), calcium 9.5 (ref 8.6-10.2), 25-OH vitamin D 51  Labs 02/02/20: TSH 3.73, free T4 1.1, free T3 2.3, TSI <89  Labs 12/05/19: TSH 0.21, free T4 1.3, free T3 2.9; PTH 63, calcium 9.5  Labs 11/01/18: TSH 1.09, free T4 1.3, free T3 2.6; PTH 56, calcium 9.8, 25-OH vitamin D 47  Labs 04/14/18: TSH 1.06, free T4 1.2, free T3 2.6; PTH 29, calcium 9.6, 25-OH vitamin D 71  Labs 12/15/17: TSH 1.75, free T4 1.4, free T3 2.7; CMP normal; 25-OH vitamin D 70, calcium 8.9, PTH 66 (ref 14-64)  Labs 07/06/17: TSH 1.35, free T4 1.4, free T3 3.2;  iron 101 (ref 40-190), CBC normal   Labs 03/30/17: TSI 114 (ref <140), TSH 0.82, free T4 1.4, free T3 2.6; calcium 9.7, PTH 47, 25-OH vitamin D 57  Labs 01/02/17: TSH 4.83, free T4 1.0, free T3 2.2, TSI 147 (ref <140)  Labs 10/23/16: PTH 83 (ref 14-64), calcium 9.3, 25-OH vitamin D 10 (ref 30-100)  Labs 10/11/16: TSH 0.74, free T4 1.2, free T3 2.7, TSI 147  Labs 07/07/16: TSH 1.08, free T4 1.2, free T3 2.9, TSI < 89  Labs 04/13/16: TSH 3.54, free T4 1.30, free T3 2.4  Labs 12/17/15: TSH 2.35, free T4 1.40, free T3 2.7, TSI 94  Labs 10/04/15: TSH 4.290, free T4 1.15, free T3 2.4, TSI 62 (ref <140)  Labs 03/30/15:TSH 0.382, free T4 1.29, free T3 3.1  Labs 08/19/14: TSH 1.027, free T4 1.07, free T3 2.6  Labs 05/20/14: TSH 0.736, free T4 1.34, free T3 3.1,   Labs 10/20/13; TSH 2.414, free T4 1.2, free T3 2.8  Labs 08/05/13: TSH 8.221, free T4 1.18, free T3 2.5. She may have missed the half-dose last Sunday.  Labs 04/25/13: TSH 0.225, free T4 1.30, free T3 2.8  Labs 02/17/13: TSH 3.069, free T4 1.27, free T3 2.5  Labs 08/06/12: TSH 3.072, free T4 1.22, free T3 2.7  Labs 12/12/11: TSH 2.456, free T4 1.49, Free T3 2.7, FSH 2.9, LH 3.5, TSI 82 (<140%),     Assessment and Plan:   ASSESSMENT:  1-3. Hypothyroid, s/p I-131 therapy for Graves' disease on 3/23/7 and 10/12/06/ Graves' Dz/Hashimoto's thyroiditis    A. Patient's TFTs and TSI levels have continued to fluctuate over time. We have had to adjust her Synthroid doses over time to compensate for the fluctuations in her TSI and TFT levels.   B. In January 2021 she was hyperthyroid. Her hyperthyroidism with tremor was likely due to a recurrence of Graves' disease. In such a circumstance, we needed to  reduce Synthroid hormone as much as necessary to keep her TSH in the goal range of 1.0-2.0.   C. In March 2021 she was euthyroid according to the lab's reference range, but her TSH was above 3.4, which is the value accepted by many  thyroidologists for the true physiologic upper limit of normal above. In July her TSH was higher and frankly elevated. She increased her Synthroid dosage at that time.   D. Her TFTs in November 2021 are quite good. She feels good today and is clinically euthyroid.    E. Her Graves Dz is still fighting with her Hashimoto's Dz for dominance, with the background of being hypothyroid due to her radioactive iodine therapy. On most days the Hashimoto's disease is her major factor. We are doing our best to keep her Synthroid doses appropriate to her needs 4. Goiter: Her thyroid gland had shrunk back to normal size in December 2019.  Her thyroiditis appeared to be clinically quiescent.  5. Exophthalmos: Her left eye is still somewhat more prominent than the right eye, but appears essentially unchanged from her last several visits and is well within normal limits. There are no eye proptosis, lid lag, or restriction of extra-ocular muscle movements in either eye today. Her exophthalmos parallels her TSI activity.   6. Oligomenorrhea and menometrorrhagia: Resolved 7. Overweight: She is doing better.  8-9. Vitamin D deficiency/secondary hyperparathyroidism/relative hypocalcemia:   A. Her December 2017 labs showed that she was hyperparathyroid secondary to vitamin D deficiency disease. Since starting Biotech, however, her lab tests in May 2018 showed that her vitamin D level had normalized and her PTH level had normalized.   B. Her February 2019 labs show that her vitamin D was in the middle of the normal range, but her PTH was slightly elevated and her calcium was still low normal, despite the effect of the increased PTH to cause resorption of calcium from her bone stores. These labs show that she was not taking in enough calcium, so she had secondary hyperparathyroidism, secondary then to inadequate calcium intake. She needed at least one good calcium pill per day. The Citracal form of calcium citrate was preferred. I  asked her to continue the One-A-Day for Women.   C. Her labs in June 2019 showed that despite taking the Citracal only about twice a week, her calcium had increased and her PTH had decreased as expected physiologically. In December 2019, however, her vitamin D decreased and her PTH increased, all c/w missing too many doses of vitamin D and calcium.   D. Her labs in January 2021 showed a higher PTH and lower calcium after completely stopping her calcium intake due to constipation. I asked her to resume taking calcium and add Miralax or Metamucil as needed.   E. Since then her PTH, calcium, and vitamin D levels have been normal.  10. Dyspepsia/reflux: These problems had significantly improved after taking ranitidine, but then worsened when she stopped the ranitidine,  increased her carb intake, and decreased her activity level. She is doing better now.    11. Fatigue, other. She is no longer tired.   12. Hypertension: Her BP had increased in December 2019. Her hypertension is evident again today. She also had  family history of hypertension in both parents. At her May 2021 visit I felt that it was time to exercise consistently or to begin antihypertensive treatment. Because her BP is normal at other times, she has decided not to start low-dose lisinopril treatment.   PLAN:  1. Diagnostic: Repeat TFTs, TSI, PTH, calcium, and 25-OH vitamin D in 6 months.  2. Therapeutic: Continue the Synthroid dose of 100 mcg/day.  Continue Biotech weekly. Continue One-A-Day for Women daily. Continue two Citracal 500-600 mg tablets per week.  Eat Right and exercise daily. 3. Patient education: We discussed issues of Graves' Dz, Hashimoto's Dz, exophthalmos, and the need to adjust her Synthroid doses accordingly. We discussed vitamin D intake, calcium intake, and secondary hyperparathyroidism. We also discussed hypertension. We also discussed that if she were to become pregnant again, she would need more Synthroid during the  pregnancy. She would also need to stop the lisinopril immediately.   4. Follow-up: 6 months  Level of Service: This visit lasted in excess of 60 minutes. More than 50% of the visit was devoted to counseling.  Tillman Sers, MD, CDE Adult and Pediatric Endocrinology 09/13/2020 2:22 PM    This is a Pediatric Specialist E-Visit follow up consult provided via [place] consented to an E-Visit consult today.  Location of patient: [place] is at her home. Location of provider: Renee Rival is at his office. Patient was referred by Youlanda Roys, MD   The following participants were involved in this E-Visit: Ms. Diggs and Dr. Tobe Sos.  This visit was done via VIDEO   Chief Complain/ Reason for E-Visit today: Graves disease, hypothyroidism, and associated problems.  Total time on call: 35 minutes Follow up: 6 months

## 2020-10-01 ENCOUNTER — Other Ambulatory Visit (INDEPENDENT_AMBULATORY_CARE_PROVIDER_SITE_OTHER): Payer: Self-pay | Admitting: "Endocrinology

## 2020-10-01 DIAGNOSIS — E890 Postprocedural hypothyroidism: Secondary | ICD-10-CM

## 2020-10-06 DIAGNOSIS — Z30430 Encounter for insertion of intrauterine contraceptive device: Secondary | ICD-10-CM | POA: Diagnosis not present

## 2020-10-31 ENCOUNTER — Other Ambulatory Visit (INDEPENDENT_AMBULATORY_CARE_PROVIDER_SITE_OTHER): Payer: Self-pay | Admitting: "Endocrinology

## 2020-10-31 DIAGNOSIS — E890 Postprocedural hypothyroidism: Secondary | ICD-10-CM

## 2020-11-02 DIAGNOSIS — R928 Other abnormal and inconclusive findings on diagnostic imaging of breast: Secondary | ICD-10-CM | POA: Diagnosis not present

## 2020-11-03 DIAGNOSIS — Z09 Encounter for follow-up examination after completed treatment for conditions other than malignant neoplasm: Secondary | ICD-10-CM | POA: Diagnosis not present

## 2020-11-18 DIAGNOSIS — Z3041 Encounter for surveillance of contraceptive pills: Secondary | ICD-10-CM | POA: Diagnosis not present

## 2020-11-18 DIAGNOSIS — N760 Acute vaginitis: Secondary | ICD-10-CM | POA: Diagnosis not present

## 2020-11-18 DIAGNOSIS — Z09 Encounter for follow-up examination after completed treatment for conditions other than malignant neoplasm: Secondary | ICD-10-CM | POA: Diagnosis not present

## 2020-12-06 ENCOUNTER — Other Ambulatory Visit (INDEPENDENT_AMBULATORY_CARE_PROVIDER_SITE_OTHER): Payer: Self-pay | Admitting: "Endocrinology

## 2020-12-06 DIAGNOSIS — E890 Postprocedural hypothyroidism: Secondary | ICD-10-CM

## 2020-12-09 ENCOUNTER — Telehealth (INDEPENDENT_AMBULATORY_CARE_PROVIDER_SITE_OTHER): Payer: Self-pay

## 2020-12-09 NOTE — Telephone Encounter (Signed)
Patients medication has been approved. Pharmacy and patient notified.

## 2021-02-24 DIAGNOSIS — T8389XA Other specified complication of genitourinary prosthetic devices, implants and grafts, initial encounter: Secondary | ICD-10-CM | POA: Diagnosis not present

## 2021-02-24 DIAGNOSIS — N898 Other specified noninflammatory disorders of vagina: Secondary | ICD-10-CM | POA: Diagnosis not present

## 2021-02-24 DIAGNOSIS — D259 Leiomyoma of uterus, unspecified: Secondary | ICD-10-CM | POA: Diagnosis not present

## 2021-03-21 DIAGNOSIS — E559 Vitamin D deficiency, unspecified: Secondary | ICD-10-CM | POA: Diagnosis not present

## 2021-03-21 DIAGNOSIS — E890 Postprocedural hypothyroidism: Secondary | ICD-10-CM | POA: Diagnosis not present

## 2021-03-22 LAB — T4, FREE: Free T4: 1.6 ng/dL (ref 0.8–1.8)

## 2021-03-22 LAB — VITAMIN D 25 HYDROXY (VIT D DEFICIENCY, FRACTURES): Vit D, 25-Hydroxy: 73 ng/mL (ref 30–100)

## 2021-03-22 LAB — TSH: TSH: 0.2 mIU/L — ABNORMAL LOW

## 2021-03-22 LAB — COMPREHENSIVE METABOLIC PANEL
AG Ratio: 1.5 (calc) (ref 1.0–2.5)
ALT: 10 U/L (ref 6–29)
AST: 14 U/L (ref 10–35)
Albumin: 4 g/dL (ref 3.6–5.1)
Alkaline phosphatase (APISO): 76 U/L (ref 31–125)
BUN: 9 mg/dL (ref 7–25)
CO2: 24 mmol/L (ref 20–32)
Calcium: 9.2 mg/dL (ref 8.6–10.2)
Chloride: 104 mmol/L (ref 98–110)
Creat: 0.71 mg/dL (ref 0.50–1.10)
Globulin: 2.7 g/dL (calc) (ref 1.9–3.7)
Glucose, Bld: 75 mg/dL (ref 65–139)
Potassium: 3.8 mmol/L (ref 3.5–5.3)
Sodium: 137 mmol/L (ref 135–146)
Total Bilirubin: 0.3 mg/dL (ref 0.2–1.2)
Total Protein: 6.7 g/dL (ref 6.1–8.1)

## 2021-03-22 LAB — T3, FREE: T3, Free: 2.8 pg/mL (ref 2.3–4.2)

## 2021-03-22 LAB — PTH, INTACT AND CALCIUM
Calcium: 9.2 mg/dL (ref 8.6–10.2)
PTH: 73 pg/mL (ref 16–77)

## 2021-03-22 LAB — SPECIMEN COMPROMISED

## 2021-03-24 NOTE — Progress Notes (Signed)
Subjective:  Patient Name: Jessica Maxwell Date of Birth: 08/28/1976  MRN: [id number]  Jessica Maxwell  presents for her clinic visit today for follow-up of her hypothyroidism s/p I-131 therapy for Graves' disease, exophthalmos, goiter, Hashimoto's thyroiditis, fatigue, dyspepsia, hirsutism, hypocalcemia, vitamin D deficiency, and secondary hyperparathyroidism.  HISTORY OF PRESENT ILLNESS:   Jessica Maxwell is a 45 y.o. African-American woman.  Jessica Maxwell was unaccompanied.  1. The patient was referred to me on 11/07/2005 by Dr. Milagros Evener of the [facility] for evaluation and management of  thyrotoxicosis caused by Graves' disease, exophthalmos, weight loss, tachycardia, and tremor. The patient was then 45 years old.  A. The patient had begun to develop symptoms and signs of thyrotoxicosis at age 46-19. By the Summer of  2006 she had begun to miss menstrual periods. She saw Dr. Radene Ou in October 2006. Dr. Radene Ou diagnosed Graves disease clinically and ordered lab tests. On 08/28/05 the TSH was 0.022. Free T4 was 6.28. Dr. Radene Ou started the patient on methimazole, which reduced the free T4 to 1.6 by 09/19/05, but the patient developed an extensive rash on day 21 of therapy, so Dr. Radene Ou appropriately discontinued the medication on 09/22/05.  Thyroid US on 09/04/05 showed a 10x16 mm nodule in the right superior pole. Echotexture in both lobes was inhomogeneous. Radioactive iodine uptake on 10/06/05 was 79% (normal 15-30%). The previously identified "nodule" had avid uptake, similar to the uptake in the remainder of the thyroid gland. The radiologic impression was: "findings compatible with diffuse toxic goiter". Dr. Radene Ou also treated her with atenolol, 50 mg/day.  B. At the time the patient first saw me, she was quite hyperthyroid again. Her past medical history was remarkable only for a unilateral salpingo-oophorectomy for an ectopic pregnancy. Her family history was positive for a paternal  grandfather who had been diagnosed with Graves' disease and exophthalmos and a maternal aunt who had been diagnosed with hypothyroidism.  C. On physical exam, her weight was 125 pounds, her BP was 130/88. Her heart rate was 96. Her eyes were very prominent and she had both inferior proptosis and stare. She had 1+ tongue tremor and 2+ carotid/thyroid bruits. Her thyroid gland was 30-35 grams in size and had a lobulated, firm consistency. She had a grade III/VI systolic flow murmur. She also had 1+ tremor of her outstretched fingers. TSH was.020. Free T4 was 5.0. T3 was 816 (normal 60-181). TPO antibody was 3,873.2 (normal <40-60). TSI was 3.1 (normal < 1.3). Alkaline phosphatase was 179 (normal 39-117), consistent with increased bone turnover due to hyperthyroidism.   D. The patient had a combination of both Graves' Disease with exophthalmos and Hashimoto's Thyroiditis. Her Graves' disease was clearly dominant. Due to her allergic reaction to methimazole, it was quite likely that she might have a similar reaction to propylthiouracil (PTU). I discussed the advantages and disadvantages with her of PTU therapy, I-131 therapy, and thyroidectomy. I recommended I-131 therapy and she concurred. On 01/26/06 she was treated with 12.55 mCi of I-131. Unfortunately, that dose did not destroy enough thyrocytes so she required a second dose of 26.3 mCi of I-131 on 10/12/06.  By 0/18/08 she was hypothyroid, with a TSH of 19.9, free T4 of 0.46, and free T3 of 1.2. I started her on a daily dose of 88 mcg of Synthroid at that time.   2. Clinical course: During the last 15 years we have worked with Jessica Maxwell about several issues. For a detailed discussion of these issues see my encounter note from 12/21/17:  A. Jessica Maxwell's thyroid hormone levels have fluctuated based upon flare ups of her Graves disease activity, flare ups of her Hashimoto's disease activity, her compliance with taking Synthroid, and her absorption of the Synthroid. As a  result we have adjusted her Synthroid doses many time.  B. Her Graves' disease exophthalmos has improved over time, but persists. When her allergies act up she has more conjunctival erythema and irritation than a person without exophthalmos would have.   C. She has had problems several times with secondary hyperparathyroidism due to vitamin D deficiency and hypocalcemia due to inadequate calcium intake.   D. She has gradually gained more weight over time, become mildly obese, and developed dyspepsia.   E. She has her covid vaccinations.  F. On 07/12/20 she was admitted to the [facility] for acute abdominal pain in the setting of a large right ovarian cyst. She was noted on CT scan to have a large fibroid uterus and a heterogeneous hypodense right adnexal mass. Her hCG was positive for pregnancy. Her course was c/w an early missed spontaneous abortion or a ruptured ovarian cyst.    3. The patient's last [facility] televisit was on 09/13/20. At that visit I continued her Synthroid dose of 100 mcg/day. I asked her to take one Citracal tablets of 500-600 mg strength twice weekly. . I also continued her Biotech once weekly.   A. In the interim she has been healthy, butt recently has become symptomatically somewhat hyperthyroid. She has felt that her HR has been higher. She has felt warmer. She is not having any sleeping problems. She has been more irritable and anxious.    B. On 10/06/20 she had an IUD inserted. She then had intermittent bleeding for 4 months. She was given norethindrone for 8 days beginning on 02/24/21. She felt "crazy". Since stopping that medication, her emotions have been better. She is no longer irritable, but is still a "little bit anxious".   C. Her allergies have still been "much worse". She does not take any allergy medications.   D. 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 She put her clinical rotations on hold because she has a lot going on. She is also still working at [employer].  E. She is  busy, but has not been exercising much. She has not changed her eating.   F. Her weight has decreased 13 pounds.  G. She is taking her Synthroid in the same way at 5:30 AM with lisinopril. She takes her calcium around dinner time. She takes her once weekly vitamin D at dinner.   4. Review of Systems: She has been healthy.  Constitutional: She feels "okay, but". Energy level is "up". She has not been tired. She is more jittery. She sleeps well, but is having more crazy dreams. .   Eyes: Eyes are about the same. She wears her glasses or contacts all the time now. She often feels that her left eye is a bit more pronounced than the right eye, but when she looks in the mirror she can't see a difference. She has no complaints of ocular pressure with "up and out" gaze in either eye. There are no significant eye complaints.  Neck: She has no complaints of anterior neck swelling, soreness, tenderness,  pressure, discomfort, or difficulty swallowing.  Heart: Her HR has been faster at times. Heart rate increases with exercise or other physical activity. She has no complaints of palpitations, irregular heat beats, chest pain, or chest pressure. Gastrointestinal: She has little belly hunger.  Her carb intake has remained fairly stable.  The patient has no complaints of stomach aches or pains, diarrhea, or constipation. Hands: She has more tremor.  Legs: Muscle mass and strength seem normal. She sometimes has posterior calf pains.   There are no complaints of numbness, tingling, burning, or pain. No edema is noted. Feet: There are no obvious foot problems. There are no complaints of numbness, tingling, burning, or pain. No edema is noted. GYN: LMP was on 03/05/21.    PAST MEDICAL, FAMILY, AND SOCIAL HISTORY:  Past Medical History:  Diagnosis Date  . Fatigue   . Hypothyroid   . Hypothyroidism following radioiodine therapy   . Menometrorrhagia   . Secondary oligomenorrhea   . Thyroiditis, autoimmune   .  Thyrotoxicosis with diffuse goiter   . Toxic diffuse goiter with exophthalmos     Family History  Problem Relation Age of Onset  . Heart disease Father   . Cancer Father        small bowel cancer   . Thyroid disease Maternal Aunt        hypothyroid  . Heart disease Maternal Aunt   . Heart disease Paternal Aunt   . Thyroid disease Paternal Grandfather        hyperthyroid  . Heart disease Paternal Grandfather   . Diabetes Neg Hx      Current Outpatient Medications:  .  ergocalciferol (VITAMIN D2) 1.25 MG (50000 UT) capsule, Vitamin D2 1,250 mcg (50,000 unit) capsule  Take 1 capsule every week by oral route., Disp: , Rfl:  .  levonorgestrel (MIRENA) 20 MCG/DAY IUD, Mirena 20 mcg/24 hours (7 yrs) 52 mg intrauterine device  Take 1 device by intrauterine route., Disp: , Rfl:  .  lisinopril (ZESTRIL) 2.5 MG tablet, Take 1 tablet (2.5 mg total) by mouth daily., Disp: 30 tablet, Rfl: 11 .  SYNTHROID 100 MCG tablet, TAKE 1 TABLET BY MOUTH DAILY BEFORE BREAKFAST., Disp: 30 tablet, Rfl: 5 .  Vitamin D, Ergocalciferol, (DRISDOL) 50000 units CAPS capsule, Take 50,000 Units by mouth every Sunday. , Disp: , Rfl:   Allergies as of 03/25/2021 - Review Complete 03/25/2021  Allergen Reaction Noted  . Tapazole [methimazole] Hives 04/20/2011    1. 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 She loves her job. She moved back to [place] and lives with her boyfriend. She attends [employer] for her BSN/RN degree.  2. Activities: She walks a lot at work.    3. Smoking, alcohol, or drugs: None 4. Primary Care Provider: Dr. Briscoe Deutscher at [facility] [place], fax [phone] 5. OB-GYN: [facility] on [place]: There are no other significant problems involving Ana's other body systems.   Objective:  Vital Signs:  BP 132/80 (BP Location: Right Arm, Patient Position: Sitting, Cuff Size:  Normal)   Pulse 78   Wt 153 lb 12.8 oz (69.8 kg)   BMI 28.36 kg/m    Ht Readings from Last 3 Encounters:  07/13/20 5' 1.75" (1.568 m)  11/05/18 5' 2.01" (1.575 m)  04/26/18 5' 2.6" (1.59 m)   Wt Readings from Last 3 Encounters:  03/25/21 153 lb 12.8 oz (69.8 kg)  09/13/20 166 lb 6.4 oz (75.5 kg)  03/23/20 154 lb 12.8 oz (70.2 kg)   HC Readings from Last 3 Encounters:  No data found for [facility]   Body surface area is 1.74 meters squared.  Facility  age limit for growth percentiles is 20 years. Facility age limit for growth percentiles is 20 years.   Constitutional: The patient looks healthy and appears physically and emotionally well. She is alert and bright. Her affect and insight are normal. She looks good. She has lost 13 pounds in 6 months. She does not appear overtly physically or emotionally hyperthyroid.  Eyes: There is no arcus or proptosis.   Mouth: The oropharynx appears normal. The tongue appears normal. There is normal oral moisture. There is no obvious gingivitis. There is no tongue tremor.  Neck: There are no bruits present. The thyroid gland appears normal in size. The thyroid gland is mildly enlarged toady at about 20-21 grams in size.  The consistency of the thyroid gland is soft-normal, which favors Graves' disease. There is no thyroid tenderness to palpation. Lungs: The lungs are clear. Air movement is good. Heart: The heart rhythm and rate appear normal. Heart sounds S1 and S2 are normal. I do not appreciate any pathologic heart murmurs. Abdomen: The abdominal size is mildly enlarged. Bowel sounds are normal. The abdomen is larger, soft, and non-tender. There is no obviously palpable hepatomegaly, splenomegaly, or other masses.  Arms: Muscle mass appears appropriate for age.  Hands: There is no obvious tremor. Phalangeal and metacarpophalangeal joints appear normal. Palms are normal. There is no palmar erythema.  Legs: Muscle mass appears appropriate for age. There is no  edema.  Neurologic: Muscle strength is normal for age and gender  in both the upper and the lower extremities. Muscle tone appears normal. Sensation to touch is normal in the legs.    LAB DATA:  Labs 03/21/21: TSH 0.20, free T4 1.6, free T3 2.8; CMP normal; PTH 73 (ref 16-77), calcium 9.2 (ref 8.6-10.2), 25-OH vitamin D 73;   Labs 09/10/20: TSH 0.89, free T4 1.3, free T3  2.7; 25-OH vitamin D 53  Labs 07/12/20: CMP normal; CBC normal, except WBC 11,000; hCG 443.5 (ref <5)  Labs 06/23/20: TSH 1.15, free T4 1.3, free T3 2.8, TSI <89; PTH 53, calcium 9.6, 25-OH vitamin D 40  Labs 05/14/20: TSH 5.86, free T4 1.1, free T3 2.2; PTH 40 (ref 14-64), calcium 9.5 (ref 8.6-10.2), 25-OH vitamin D 51  Labs 02/02/20: TSH 3.73, free T4 1.1, free T3 2.3, TSI <89  Labs 12/05/19: TSH 0.21, free T4 1.3, free T3 2.9; PTH 63, calcium 9.5  Labs 11/01/18: TSH 1.09, free T4 1.3, free T3 2.6; PTH 56, calcium 9.8, 25-OH vitamin D 47  Labs 04/14/18: TSH 1.06, free T4 1.2, free T3 2.6; PTH 29, calcium 9.6, 25-OH vitamin D 71  Labs 12/15/17: TSH 1.75, free T4 1.4, free T3 2.7; CMP normal; 25-OH vitamin D 70, calcium 8.9, PTH 66 (ref 14-64)  Labs 07/06/17: TSH 1.35, free T4 1.4, free T3 3.2; iron 101 (ref 40-190), CBC normal   Labs 03/30/17: TSI 114 (ref <140), TSH 0.82, free T4 1.4, free T3 2.6; calcium 9.7, PTH 47, 25-OH vitamin D 57  Labs 01/02/17: TSH 4.83, free T4 1.0, free T3 2.2, TSI 147 (ref <140)  Labs 10/23/16: PTH 83 (ref 14-64), calcium 9.3, 25-OH vitamin D 10 (ref 30-100)  Labs 10/11/16: TSH 0.74, free T4 1.2, free T3 2.7, TSI 147  Labs 07/07/16: TSH 1.08, free T4 1.2, free T3 2.9, TSI < 89  Labs 04/13/16: TSH 3.54, free T4 1.30, free T3 2.4  Labs 12/17/15: TSH 2.35, free T4 1.40, free T3 2.7, TSI 94  Labs 10/04/15: TSH 4.290, free T4 1.15, free  T3 2.4, TSI 62 (ref <140)  Labs 03/30/15:TSH 0.382, free T4 1.29, free T3 3.1  Labs 08/19/14: TSH 1.027, free T4 1.07, free T3 2.6  Labs 05/20/14: TSH 0.736,  free T4 1.34, free T3 3.1,   Labs 10/20/13; TSH 2.414, free T4 1.2, free T3 2.8  Labs 08/05/13: TSH 8.221, free T4 1.18, free T3 2.5. She may have missed the half-dose last Sunday.  Labs 04/25/13: TSH 0.225, free T4 1.30, free T3 2.8  Labs 02/17/13: TSH 3.069, free T4 1.27, free T3 2.5  Labs 08/06/12: TSH 3.072, free T4 1.22, free T3 2.7  Labs 12/12/11: TSH 2.456, free T4 1.49, Free T3 2.7, FSH 2.9, LH 3.5, TSI 82 (<140%),     Assessment and Plan:   ASSESSMENT:  1-3. Hypothyroid, s/p I-131 therapy for Graves' disease on 01/26/06 and 10/12/06/ Graves' Dz/Hashimoto's thyroiditis    A. Patient's TFTs and TSI levels have continued to fluctuate over time. We have had to adjust her Synthroid doses many timed in the past 15 years to compensate for the fluctuations in her TSI and TFT levels.   B. In January 2021 she was hyperthyroid. Her hyperthyroidism with tremor was likely due to a recurrence of Graves' disease. In such a circumstance, we needed to reduce Synthroid hormone as much as necessary to keep her TSH in the goal range of 1.0-2.0.   C. In March 2021 she was euthyroid according to the lab's reference range, but her TSH was above 3.4, which is the value accepted by many thyroidologists for the true physiologic upper limit of normal above. In July her TSH was higher and frankly elevated. She increased her Synthroid dosage at that time.   D. Her TFTs in November 2021 were quite good. She was clinically euthyroid.  E. In May 2022 she is mildly hyperthyroid clinically and chemically. Her free T4 is much higher and her free T3 is a bit higher. Consequently her TSH is much lower.  F. The two most likely causes of her hyperthyroidism are;   1). She may be  having more endogenous Graves' disease activity.   2). The IUD may have resulted in her having a lower estrogen level, so her TBG may be lower and her free thyroid hormone levels may be higher as a result.  G. Her Graves Dz is still fighting with  her 44 Dz for dominance, with the background of being hypothyroid due to her radioactive iodine therapy. On most days the Hashimoto's disease is her major factor. We are doing our best to keep her Synthroid doses appropriate to her needs 4. Goiter: Her thyroid gland had shrunk back to normal size in December 2019 and was still normal in size in November 2021. Her thyroid gland today is mildly enlarged and is softer, favoring Graves' disease. Her thyroiditis appeared to be clinically quiescent.  5. Exophthalmos: Her left eye is still somewhat more prominent than the right eye, but appears essentially unchanged from her last several visits and is well within normal limits. There are no eye proptosis, lid lag, or restriction of extra-ocular muscle movements in either eye today. Her exophthalmos parallels her TSI activity.   6. Oligomenorrhea and menometrorrhagia: Resolved 7. Overweight/unintentional weight loss: She has lost a great deal of weight, probably due to hyperthyroidism. Marland Kitchen  8-9. Vitamin D deficiency/secondary hyperparathyroidism/relative hypocalcemia:   A. Her December 2017 labs showed that she was hyperparathyroid secondary to vitamin D deficiency disease. Since starting Biotech, however, her lab tests in May 2018 showed  that her vitamin D level had normalized and her PTH level had normalized.   B. Her February 2019 labs show that her vitamin D was in the middle of the normal range, but her PTH was slightly elevated and her calcium was still low normal, despite the effect of the increased PTH to cause resorption of calcium from her bone stores. These labs show that she was not taking in enough calcium, so she had secondary hyperparathyroidism, secondary then to inadequate calcium intake. She needed at least one good calcium pill per day. The Citracal form of calcium citrate was preferred. I asked her to continue the One-A-Day for Women.   C. Her labs in June 2019 showed that despite taking  the Citracal only about twice a week, her calcium had increased and her PTH had decreased as expected physiologically. In December 2019, however, her vitamin D decreased and her PTH increased, all c/w missing too many doses of vitamin D and calcium.   D. Her labs in January 2021 showed a higher PTH and lower calcium after completely stopping her calcium intake due to constipation. I asked her to resume taking calcium and add Miralax or Metamucil as needed. In August 2021 her PTH, calcium, and vitamin D levels were normal.   E. In May 2022, however, her PTH is higher, her calcium is lower, and her vitamin D level is good. She has not been taking enough calcium and needs more calcium. 10. Dyspepsia/reflux: These problems had significantly improved after taking ranitidine, but then worsened when she stopped the ranitidine, increased her carb intake, and decreased her activity level. She is doing better now.    11. Fatigue, other. She is no longer tired.   12. Hypertension: Her BP had increased in December 2019. Her hypertension is evident again today. She also had  family history of hypertension in both parents. At her May 2021 visit I felt that it was time to exercise consistently or to begin antihypertensive treatment. Because her BP is normal at other times, she has decided not to start low-dose lisinopril treatment.   PLAN:  1. Diagnostic: Repeat TFTs, TSI, estrogen, TBG today .  Repeat PTH, calcium, and vitamin D 1-2 weeks prior to next visit.  2. Therapeutic: Continue the Synthroid dose of 100 mcg/day for 6 days each week, but skip the seventh days. Continue Biotech weekly. Continue One-A-Day for Women daily. Increase Citracal 500-600 mg tablets to one tablet per day on 4 days per week. per week.  Eat Right and exercise daily. 3. Patient education: We discussed issues of Graves' Dz, Hashimoto's Dz, exophthalmos, and the need to adjust her Synthroid doses accordingly. We discussed vitamin D intake,  calcium intake, and secondary hyperparathyroidism. We also discussed hypertension.  4. Follow-up: 3 months  Level of Service: This visit lasted in excess of 55 minutes. More than 50% of the visit was devoted to counseling.  Tillman Sers, MD, CDE Adult and Pediatric Endocrinology 03/25/2021 9:03 AM

## 2021-03-25 ENCOUNTER — Other Ambulatory Visit: Payer: Self-pay

## 2021-03-25 ENCOUNTER — Ambulatory Visit (INDEPENDENT_AMBULATORY_CARE_PROVIDER_SITE_OTHER): Payer: BC Managed Care – PPO | Admitting: "Endocrinology

## 2021-03-25 ENCOUNTER — Encounter (INDEPENDENT_AMBULATORY_CARE_PROVIDER_SITE_OTHER): Payer: Self-pay | Admitting: "Endocrinology

## 2021-03-25 VITALS — BP 132/80 | HR 78 | Wt 153.8 lb

## 2021-03-25 DIAGNOSIS — R1013 Epigastric pain: Secondary | ICD-10-CM

## 2021-03-25 DIAGNOSIS — N921 Excessive and frequent menstruation with irregular cycle: Secondary | ICD-10-CM

## 2021-03-25 DIAGNOSIS — E559 Vitamin D deficiency, unspecified: Secondary | ICD-10-CM | POA: Diagnosis not present

## 2021-03-25 DIAGNOSIS — E063 Autoimmune thyroiditis: Secondary | ICD-10-CM | POA: Diagnosis not present

## 2021-03-25 DIAGNOSIS — E211 Secondary hyperparathyroidism, not elsewhere classified: Secondary | ICD-10-CM

## 2021-03-25 DIAGNOSIS — E0500 Thyrotoxicosis with diffuse goiter without thyrotoxic crisis or storm: Secondary | ICD-10-CM

## 2021-03-25 DIAGNOSIS — R5383 Other fatigue: Secondary | ICD-10-CM

## 2021-03-25 NOTE — Patient Instructions (Signed)
Follow up visit in 3 months. Please have lab tests drawn 1-2 weeks prior.

## 2021-03-27 ENCOUNTER — Encounter (INDEPENDENT_AMBULATORY_CARE_PROVIDER_SITE_OTHER): Payer: Self-pay

## 2021-03-30 LAB — T4, FREE: Free T4: 1.5 ng/dL (ref 0.8–1.8)

## 2021-03-30 LAB — THYROID STIMULATING IMMUNOGLOBULIN: TSI: <89 % baseline (ref <140)

## 2021-03-30 LAB — ESTRADIOL, ULTRA SENS: Estradiol, Ultra Sensitive: 311 pg/mL

## 2021-03-30 LAB — TSH: TSH: 0.16 mIU/L — ABNORMAL LOW

## 2021-03-30 LAB — PTH, INTACT AND CALCIUM
Calcium: 9.3 mg/dL (ref 8.6–10.2)
PTH: 48 pg/mL (ref 16–77)

## 2021-03-30 LAB — T3, FREE: T3, Free: 3 pg/mL (ref 2.3–4.2)

## 2021-03-30 LAB — VITAMIN D 25 HYDROXY (VIT D DEFICIENCY, FRACTURES): Vit D, 25-Hydroxy: 71 ng/mL (ref 30–100)

## 2021-03-30 LAB — THYROXINE BINDING GLOBULIN: TBG: 20.8 ug/mL (ref 13.5–30.9)

## 2021-04-01 ENCOUNTER — Encounter (INDEPENDENT_AMBULATORY_CARE_PROVIDER_SITE_OTHER): Payer: Self-pay | Admitting: *Deleted

## 2021-04-03 ENCOUNTER — Other Ambulatory Visit (INDEPENDENT_AMBULATORY_CARE_PROVIDER_SITE_OTHER): Payer: Self-pay | Admitting: "Endocrinology

## 2021-04-03 DIAGNOSIS — I10 Essential (primary) hypertension: Secondary | ICD-10-CM

## 2021-06-09 ENCOUNTER — Other Ambulatory Visit (INDEPENDENT_AMBULATORY_CARE_PROVIDER_SITE_OTHER): Payer: Self-pay | Admitting: "Endocrinology

## 2021-06-09 DIAGNOSIS — E890 Postprocedural hypothyroidism: Secondary | ICD-10-CM

## 2021-06-22 DIAGNOSIS — E211 Secondary hyperparathyroidism, not elsewhere classified: Secondary | ICD-10-CM | POA: Diagnosis not present

## 2021-06-22 DIAGNOSIS — E0500 Thyrotoxicosis with diffuse goiter without thyrotoxic crisis or storm: Secondary | ICD-10-CM | POA: Diagnosis not present

## 2021-06-22 DIAGNOSIS — E559 Vitamin D deficiency, unspecified: Secondary | ICD-10-CM | POA: Diagnosis not present

## 2021-06-23 ENCOUNTER — Other Ambulatory Visit (INDEPENDENT_AMBULATORY_CARE_PROVIDER_SITE_OTHER): Payer: Self-pay

## 2021-06-23 ENCOUNTER — Telehealth (INDEPENDENT_AMBULATORY_CARE_PROVIDER_SITE_OTHER): Payer: Self-pay | Admitting: "Endocrinology

## 2021-06-23 DIAGNOSIS — E063 Autoimmune thyroiditis: Secondary | ICD-10-CM

## 2021-06-23 DIAGNOSIS — E0500 Thyrotoxicosis with diffuse goiter without thyrotoxic crisis or storm: Secondary | ICD-10-CM

## 2021-06-23 NOTE — Telephone Encounter (Signed)
  Who's calling (name and relationship to patient) : Self  Best contact number: [phone]  Provider they see: Tobe Sos  Reason for call: Had labs drawn yesterday, but thyroid labs were not in. Lab drew enough blood to run thyroid test if we can place orders. Please contact [facility] on [facility] and discuss. They will hold patient's labs for 72 hours only.       PRESCRIPTION REFILL ONLY  Name of prescription:  Pharmacy:

## 2021-06-23 NOTE — Telephone Encounter (Signed)
Called [facility] and added labs on to current lab work. Called pt to let her know.

## 2021-06-24 LAB — TSH: TSH: 5.96 mIU/L — ABNORMAL HIGH

## 2021-06-24 LAB — COMPREHENSIVE METABOLIC PANEL
AG Ratio: 1.3 (calc) (ref 1.0–2.5)
ALT: 7 U/L (ref 6–29)
AST: 14 U/L (ref 10–35)
Albumin: 3.9 g/dL (ref 3.6–5.1)
Alkaline phosphatase (APISO): 80 U/L (ref 31–125)
BUN: 8 mg/dL (ref 7–25)
CO2: 20 mmol/L (ref 20–32)
Calcium: 9.7 mg/dL (ref 8.6–10.2)
Chloride: 104 mmol/L (ref 98–110)
Creat: 0.81 mg/dL (ref 0.50–0.99)
Globulin: 2.9 g/dL (calc) (ref 1.9–3.7)
Glucose, Bld: 77 mg/dL (ref 65–139)
Potassium: 4 mmol/L (ref 3.5–5.3)
Sodium: 139 mmol/L (ref 135–146)
Total Bilirubin: 0.3 mg/dL (ref 0.2–1.2)
Total Protein: 6.8 g/dL (ref 6.1–8.1)

## 2021-06-24 LAB — T3, FREE: T3, Free: 2.4 pg/mL (ref 2.3–4.2)

## 2021-06-24 LAB — PTH, INTACT AND CALCIUM
Calcium: 9.3 mg/dL (ref 8.6–10.2)
PTH: 53 pg/mL (ref 16–77)

## 2021-06-24 LAB — TEST AUTHORIZATION

## 2021-06-24 LAB — T4, FREE: Free T4: 1.1 ng/dL (ref 0.8–1.8)

## 2021-06-24 LAB — VITAMIN D 25 HYDROXY (VIT D DEFICIENCY, FRACTURES): Vit D, 25-Hydroxy: 79 ng/mL (ref 30–100)

## 2021-07-01 ENCOUNTER — Ambulatory Visit (INDEPENDENT_AMBULATORY_CARE_PROVIDER_SITE_OTHER): Payer: BC Managed Care – PPO | Admitting: "Endocrinology

## 2021-07-01 ENCOUNTER — Encounter (INDEPENDENT_AMBULATORY_CARE_PROVIDER_SITE_OTHER): Payer: Self-pay | Admitting: "Endocrinology

## 2021-07-01 ENCOUNTER — Other Ambulatory Visit: Payer: Self-pay

## 2021-07-01 VITALS — BP 128/80 | HR 84 | Wt 164.0 lb

## 2021-07-01 DIAGNOSIS — E663 Overweight: Secondary | ICD-10-CM

## 2021-07-01 DIAGNOSIS — E349 Endocrine disorder, unspecified: Secondary | ICD-10-CM

## 2021-07-01 DIAGNOSIS — R5383 Other fatigue: Secondary | ICD-10-CM

## 2021-07-01 DIAGNOSIS — E559 Vitamin D deficiency, unspecified: Secondary | ICD-10-CM

## 2021-07-01 DIAGNOSIS — E0500 Thyrotoxicosis with diffuse goiter without thyrotoxic crisis or storm: Secondary | ICD-10-CM | POA: Diagnosis not present

## 2021-07-01 DIAGNOSIS — E063 Autoimmune thyroiditis: Secondary | ICD-10-CM | POA: Diagnosis not present

## 2021-07-01 DIAGNOSIS — I10 Essential (primary) hypertension: Secondary | ICD-10-CM

## 2021-07-01 DIAGNOSIS — R1013 Epigastric pain: Secondary | ICD-10-CM

## 2021-07-01 NOTE — Progress Notes (Signed)
Subjective:  Patient Name: Jessica Maxwell Date of Birth: 08/28/1976  MRN: [id number]  Jessica Maxwell  presents for her clinic visit today for follow-up of her hypothyroidism s/p I-131 therapy for Graves' disease, exophthalmos, goiter, Hashimoto's thyroiditis, fatigue, dyspepsia, hirsutism, hypocalcemia, vitamin D deficiency, and secondary hyperparathyroidism.  HISTORY OF PRESENT ILLNESS:   [name] is a 45 y.o. African-American woman.  [name] was unaccompanied.  1. The patient was referred to me on 11/07/2005 by Dr. Milagros Evener of the [facility] for evaluation and management of  thyrotoxicosis caused by Graves' disease, exophthalmos, weight loss, tachycardia, and tremor. The patient was then 45 years old.  A. The patient had begun to develop symptoms and signs of thyrotoxicosis at age 46-19. By the Summer of  2006 she had begun to miss menstrual periods. She saw Dr. Radene Ou in October 2006. Dr. Radene Ou diagnosed Graves disease clinically and ordered lab tests. On 08/28/05 the TSH was 0.022. Free T4 was 6.28. Dr. Radene Ou started the patient on methimazole, which reduced the free T4 to 1.6 by 09/19/05, but the patient developed an extensive rash on day 21 of therapy, so Dr. Radene Ou appropriately discontinued the medication on 09/22/05.  Thyroid US on 09/04/05 showed a 10x16 mm nodule in the right superior pole. Echotexture in both lobes was inhomogeneous. Radioactive iodine uptake on 10/06/05 was 79% (normal 15-30%). The previously identified "nodule" had avid uptake, similar to the uptake in the remainder of the thyroid gland. The radiologic impression was: "findings compatible with diffuse toxic goiter". Dr. Radene Ou also treated her with atenolol, 50 mg/day.  B. At the time the patient first saw me, she was quite hyperthyroid again. Her past medical history was remarkable only for a unilateral salpingo-oophorectomy for an ectopic pregnancy. Her family history was positive for a paternal  grandfather who had been diagnosed with Graves' disease and exophthalmos and a maternal aunt who had been diagnosed with hypothyroidism.  C. On physical exam, her weight was 125 pounds, her BP was 130/88. Her heart rate was 96. Her eyes were very prominent and she had both inferior proptosis and stare. She had 1+ tongue tremor and 2+ carotid/thyroid bruits. Her thyroid gland was 30-35 grams in size and had a lobulated, firm consistency. She had a grade III/VI systolic flow murmur. She also had 1+ tremor of her outstretched fingers. TSH was.020. Free T4 was 5.0. T3 was 816 (normal 60-181). TPO antibody was 3,873.2 (normal <40-60). TSI was 3.1 (normal < 1.3). Alkaline phosphatase was 179 (normal 39-117), consistent with increased bone turnover due to hyperthyroidism.   D. The patient had a combination of both Graves' Disease with exophthalmos and Hashimoto's Thyroiditis. Her Graves' disease was clearly dominant. Due to her allergic reaction to methimazole, it was quite likely that she might have a similar reaction to propylthiouracil (PTU). I discussed the advantages and disadvantages with her of PTU therapy, I-131 therapy, and thyroidectomy. I recommended I-131 therapy and she concurred. On 01/26/06 she was treated with 12.55 mCi of I-131. Unfortunately, that dose did not destroy enough thyrocytes so she required a second dose of 26.3 mCi of I-131 on 10/12/06.  By 0/18/08 she was hypothyroid, with a TSH of 19.9, free T4 of 0.46, and free T3 of 1.2. I started her on a daily dose of 88 mcg of Synthroid at that time.   2. Clinical course: During the last 15 years we have worked with [name] about several issues. For a detailed discussion of these issues see my encounter note from 12/21/17:  A. [name] thyroid hormone levels have fluctuated based upon flare ups of her Graves disease activity, flare ups of her Hashimoto's disease activity, her compliance with taking Synthroid, and her absorption of the Synthroid. As a  result we have adjusted her Synthroid doses many time.  B. Her Graves' disease exophthalmos has improved over time, but persists. When her allergies act up she has more conjunctival erythema and irritation than a person without exophthalmos would have.   C. She has had problems several times with secondary hyperparathyroidism due to vitamin D deficiency and hypocalcemia due to inadequate calcium intake.   D. She has gradually gained more weight over time, become mildly obese, and developed dyspepsia.   E. She has her covid vaccinations.  F. On 07/12/20 she was admitted to the [facility] for acute abdominal pain in the setting of a large right ovarian cyst. She was noted on CT scan to have a large fibroid uterus and a heterogeneous hypodense right adnexal mass. Her hCG was positive for pregnancy. Her course was c/w an early missed spontaneous abortion or a ruptured ovarian cyst.   G. On 10/06/20 she had an IUD inserted. She then had intermittent bleeding for 4 months.  3. The patient's last [facility] televisit was on 03/25/21. At that visit I continued her Synthroid dose of 100 mcg/day for 6 days per week. I asked her to take one Citracal tablets of 500-600 mg strength twice weekly, but she is reluctant to do so because she does not want to be constipated. I also continued her Biotech once weekly. She stopped the lisinopril because of coughing. Her BPs at home have been 120-130/80s  A. In the interim she has been healthy, but feels tired. Her temperature sensitivity is normal. She is sleeping well. She is no longer irritable and anxious.    B.  The IUD is still in place. She continues to have intermittent spotting.   C. Her allergies continue, but are not as bad. She does not take any allergy medications.   D. 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 She is also still working at [employer].  E. She is busy, but has not been exercising much.  She has not changed her eating.   F. Her weight has increased 11 pounds.  G. She is taking her Synthroid in the same way at 5:30 AM. She takes her calcium around dinner time when she takes it. She takes her once weekly vitamin D at dinner.   4. Review of Systems: She has been healthy.  Constitutional: She feels "just tired". Energy level is "down".  She is not jittery. She sleeps moe fitfully now and is not having any more crazy dreams. She may be taking in more caffeine. She is having more headaches in the mornings.  Eyes: Eyes are about the same. She wears her glasses or contacts all the time now. She often feels that her left eye is a bit more pronounced than the right eye, but when she looks in the mirror she can't see a difference. She has no complaints of ocular pressure with "up and out" gaze in either eye. There are no significant eye complaints.  Neck: She has no complaints of anterior neck swelling, soreness, tenderness,  pressure, discomfort, or difficulty swallowing.  Heart: Her HR has usually been slower. Heart rate increases with exercise or other physical activity. She has no complaints of palpitations,  irregular heat beats, chest pain, or chest pressure. Gastrointestinal: She has more belly hunger. Her carb intake has remained fairly stable.  The patient has no complaints of stomach aches or pains, diarrhea, or constipation. Hands: She has less tremor.  Legs: Muscle mass and strength seem normal. She no longer has posterior calf pains.   There are no complaints of numbness, tingling, burning, or pain. No edema is noted. Feet: There are no obvious foot problems. There are no complaints of numbness, tingling, burning, or pain. No edema is noted. GYN: LMP was on 05/29/21.    PAST MEDICAL, FAMILY, AND SOCIAL HISTORY:  Past Medical History:  Diagnosis Date   Fatigue    Hypothyroid    Hypothyroidism following radioiodine therapy    Menometrorrhagia    Secondary oligomenorrhea     Thyroiditis, autoimmune    Thyrotoxicosis with diffuse goiter    Toxic diffuse goiter with exophthalmos     Family History  Problem Relation Age of Onset   Heart disease Father    Cancer Father        small bowel cancer    Thyroid disease Maternal Aunt        hypothyroid   Heart disease Maternal Aunt    Heart disease Paternal Aunt    Thyroid disease Paternal Grandfather        hyperthyroid   Heart disease Paternal Grandfather    Diabetes Neg Hx      Current Outpatient Medications:    levonorgestrel (MIRENA) 20 MCG/DAY IUD, Mirena 20 mcg/24 hours (7 yrs) 52 mg intrauterine device  Take 1 device by intrauterine route., Disp: , Rfl:    SYNTHROID 100 MCG tablet, Take 1 tablet 6 days a week. None on the 7th day, Disp: 80 tablet, Rfl: 1   Vitamin D, Ergocalciferol, (DRISDOL) 50000 units CAPS capsule, Take 50,000 Units by mouth every Sunday. , Disp: , Rfl:    ergocalciferol (VITAMIN D2) 1.25 MG (50000 UT) capsule, Vitamin D2 1,250 mcg (50,000 unit) capsule  Take 1 capsule every week by oral route. (Patient not taking: Reported on 07/01/2021), Disp: , Rfl:    lisinopril (ZESTRIL) 2.5 MG tablet, TAKE 1 TABLET BY MOUTH EVERY DAY (Patient not taking: Reported on 07/01/2021), Disp: 30 tablet, Rfl: 11  Allergies as of 07/01/2021 - Review Complete 07/01/2021  Allergen Reaction Noted   Lisinopril Cough 07/01/2021   Tapazole [methimazole] Hives 04/20/2011    1. 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 She loves her job. She moved back to [place] and lives with her boyfriend. She attends [employer] for her BSN/RN degree.  2. Activities: She walks a lot at work.    3. Smoking, alcohol, or drugs: None 4. Primary Care Provider: Dr. Briscoe Deutscher at [facility] [place], fax [phone] 5. OB-GYN: [facility] on [address]: There are no other significant problems involving Naureen's other body  systems.   Objective:  Vital Signs:  BP 128/80 (BP Location: Right Arm, Patient Position: Sitting, Cuff Size: Normal)   Pulse 84   Wt 164 lb (74.4 kg)   BMI 30.24 kg/m    Ht Readings from Last 3 Encounters:  07/13/20 5' 1.75" (1.568 m)  11/05/18 5' 2.01" (1.575 m)  04/26/18 5' 2.6" (1.59 m)   Wt Readings from Last 3 Encounters:  07/01/21 164 lb (74.4 kg)  03/25/21 153 lb 12.8 oz (69.8 kg)  09/13/20 166  lb 6.4 oz (75.5 kg)   HC Readings from Last 3 Encounters:  No data found for [facility]   Body surface area is 1.8 meters squared.  Facility age limit for growth percentiles is 20 years. Facility age limit for growth percentiles is 20 years.   Constitutional: The patient looks healthy and appears physically and emotionally well. She is alert and bright. Her affect and insight are normal. She looks good. She has gained 11 pounds in 6 months. She does not appear overtly physically or emotionally hyperthyroid.  Eyes: There is no arcus or proptosis. Her eyes are relatively prominent, but there is no lid lag or decreased range of motion.  Mouth: The oropharynx appears normal. The tongue appears normal. There is normal oral moisture. There is no obvious gingivitis. There is no tongue tremor.  Neck: There are no bruits present. The thyroid gland appears normal in size. The thyroid gland is mildly enlarged today at about 20-21 grams in size.  The right lobe has shrunk back to normal size, but the left lobe is still mildly enlarged. The consistency of the thyroid gland is soft-normal, which favors Graves' disease. There is no thyroid tenderness to palpation. Lungs: The lungs are clear. Air movement is good. Heart: The heart rhythm and rate appear normal. Heart sounds S1 and S2 are normal. I do not appreciate any pathologic heart murmurs. Abdomen: The abdomen is mildly enlarged. Bowel sounds are normal. The abdomen is larger, soft, and non-tender. There is no obviously palpable hepatomegaly,  splenomegaly, or other masses.  Arms: Muscle mass appears appropriate for age.  Hands: There is no obvious tremor. Phalangeal and metacarpophalangeal joints appear normal. Palms are normal. There is no palmar erythema.  Legs: Muscle mass appears appropriate for age. There is no edema.  Neurologic: Muscle strength is normal for age and gender  in both the upper and the lower extremities. Muscle tone appears normal. Sensation to touch is normal in the legs.    LAB DATA:  Labs 06/22/21: TSH 5.96,  free T4 1.5, free T3 2.4; CMP normal; PTH 53 (ref 16-77), calcium 9.3, 25-OH vitamin D 79  Labs 03/21/21: TSH 0.20, free T4 1.6, free T3 2.8; CMP normal; PTH 73 (ref 16-77), calcium 9.2 (ref 8.6-10.2), 25-OH vitamin D 73;   Labs 09/10/20: TSH 0.89, free T4 1.3, free T3  2.7; 25-OH vitamin D 53  Labs 07/12/20: CMP normal; CBC normal, except WBC 11,000; hCG 443.5 (ref <5)  Labs 06/23/20: TSH 1.15, free T4 1.3, free T3 2.8, TSI <89; PTH 53, calcium 9.6, 25-OH vitamin D 40  Labs 05/14/20: TSH 5.86, free T4 1.1, free T3 2.2; PTH 40 (ref 14-64), calcium 9.5 (ref 8.6-10.2), 25-OH vitamin D 51  Labs 02/02/20: TSH 3.73, free T4 1.1, free T3 2.3, TSI <89  Labs 12/05/19: TSH 0.21, free T4 1.3, free T3 2.9; PTH 63, calcium 9.5  Labs 11/01/18: TSH 1.09, free T4 1.3, free T3 2.6; PTH 56, calcium 9.8, 25-OH vitamin D 47  Labs 04/14/18: TSH 1.06, free T4 1.2, free T3 2.6; PTH 29, calcium 9.6, 25-OH vitamin D 71  Labs 12/15/17: TSH 1.75, free T4 1.4, free T3 2.7; CMP normal; 25-OH vitamin D 70, calcium 8.9, PTH 66 (ref 14-64)  Labs 07/06/17: TSH 1.35, free T4 1.4, free T3 3.2; iron 101 (ref 40-190), CBC normal   Labs 03/30/17: TSI 114 (ref <140), TSH 0.82, free T4 1.4, free T3 2.6; calcium 9.7, PTH 47, 25-OH vitamin D 57  Labs 01/02/17: TSH 4.83,  free T4 1.0, free T3 2.2, TSI 147 (ref <140)  Labs 10/23/16: PTH 83 (ref 14-64), calcium 9.3, 25-OH vitamin D 10 (ref 30-100)  Labs 10/11/16: TSH 0.74, free T4 1.2, free T3  2.7, TSI 147  Labs 07/07/16: TSH 1.08, free T4 1.2, free T3 2.9, TSI < 89  Labs 04/13/16: TSH 3.54, free T4 1.30, free T3 2.4  Labs 12/17/15: TSH 2.35, free T4 1.40, free T3 2.7, TSI 94  Labs 10/04/15: TSH 4.290, free T4 1.15, free T3 2.4, TSI 62 (ref <140)  Labs 03/30/15:TSH 0.382, free T4 1.29, free T3 3.1  Labs 08/19/14: TSH 1.027, free T4 1.07, free T3 2.6  Labs 05/20/14: TSH 0.736, free T4 1.34, free T3 3.1,   Labs 10/20/13; TSH 2.414, free T4 1.2, free T3 2.8  Labs 08/05/13: TSH 8.221, free T4 1.18, free T3 2.5. She may have missed the half-dose last Sunday.  Labs 04/25/13: TSH 0.225, free T4 1.30, free T3 2.8  Labs 02/17/13: TSH 3.069, free T4 1.27, free T3 2.5  Labs 08/06/12: TSH 3.072, free T4 1.22, free T3 2.7  Labs 12/12/11: TSH 2.456, free T4 1.49, Free T3 2.7, FSH 2.9, LH 3.5, TSI 82 (<140%),     Assessment and Plan:   ASSESSMENT:  1-3. Hypothyroid, s/p I-131 therapy for Graves' disease on 01/26/06 and 10/12/06/ Graves' Dz/Hashimoto's thyroiditis    A. Patient's TFTs and TSI levels have continued to fluctuate over time. We have had to adjust her Synthroid doses many timed in the past 15 years to compensate for the fluctuations in her TSI and TFT levels.   B. In January 2021 she was hyperthyroid. Her hyperthyroidism with tremor was likely due to a recurrence of Graves' disease. In such a circumstance, we needed to reduce Synthroid hormone as much as necessary to keep her TSH in the goal range of 1.0-2.0.   C. In March 2021 she was euthyroid according to the lab's reference range, but her TSH was above 3.4, which is the value accepted by many thyroidologists for the true physiologic upper limit of normal above. In July her TSH was higher and frankly elevated. She increased her Synthroid dosage at that time.   D. Her TFTs in November 2021 were quite good. She was clinically euthyroid.  E. In May 2022 she was mildly hyperthyroid clinically and chemically. Her free T4 was much  higher and her free T3 was a bit higher. Consequently her TSH was much lower. We reduced her Synthroid to one tablet per day for 6 days each week.   F. The two most likely causes of her hyperthyroidism were;   1). She may have been  having more endogenous Graves' disease activity.   2). The IUD may have resulted in her having a lower estrogen level, so her TBG may be lower and her free thyroid hormone levels may be higher as a result.  G. In August, 2022, she is hypothyroid according to her TSH. She needs more thyroid hormone.   H. Her Graves Dz is still fighting with her 44 Dz for dominance, with the background of being hypothyroid due to her radioactive iodine therapy. On most days the Hashimoto's disease is her major factor. We are doing our best to keep her Synthroid doses appropriate to her needs 4. Goiter: Her thyroid gland had shrunk back to normal size in December 2019 and was still normal in size in November 2021. Her thyroid gland in may 2022 was mildly enlarged and is softer, favoring Graves'  disease. Her goiter in August 2022 was still enlarged, but the right lobe had shrunk back to normal size. The waxing and waning of thyroid gland size is c/w evolving Hashimoto's disease. Her thyroiditis appeared to be clinically quiescent.  5. Exophthalmos: Her left eye is still somewhat more prominent than the right eye, but appears essentially unchanged from her last several visits and is well within normal limits. There are no eye proptosis, lid lag, or restriction of extra-ocular muscle movements in either eye today. Her exophthalmos parallels her TSI activity.  She does not need Tepezza at this time. 6. Oligomenorrhea and menometrorrhagia: Resolved 7. Overweight/unintentional weight loss: She had lost a great deal of weight, probably due to hyperthyroidism. She has re-gained a great deal of weight due to hypothyroidism, diet, and lack of exercise.  8-9. Vitamin D deficiency/secondary  hyperparathyroidism/relative hypocalcemia:   A. Her December 2017 labs showed that she was hyperparathyroid secondary to vitamin D deficiency disease. Since starting Bi[employer], however, her lab tests in May 2018 showed that her vitamin D level had normalized and her PTH level had normalized.   B. Her February 2019 labs show that her vitamin D was in the middle of the normal range, but her PTH was slightly elevated and her calcium was still low normal, despite the effect of the increased PTH to cause resorption of calcium from her bone stores. These labs show that she was not taking in enough calcium, so she had secondary hyperparathyroidism, secondary then to inadequate calcium intake. She needed at least one good calcium pill per day. The Citracal form of calcium citrate was preferred. I asked her to continue the One-A-Day for Women.   C. Her labs in June 2019 showed that despite taking the Citracal only about twice a week, her calcium had increased and her PTH had decreased as expected physiologically. In December 2019, however, her vitamin D decreased and her PTH increased, all c/w missing too many doses of vitamin D and calcium.   D. Her labs in January 2021 showed a higher PTH and lower calcium after completely stopping her calcium intake due to constipation. I asked her to resume taking calcium and add Miralax or Metamucil as needed. In August 2021 her PTH, calcium, and vitamin D levels were normal.   E. In May 2022 and in August 2022, her PTH is good, her calcium is low-normal, and her vitamin D level is good. She has not been taking enough calcium and needs more calcium. She has been reluctant to take the extra calcium due to prior problems with constipation. 10. Dyspepsia/reflux: These problems had significantly improved after taking ranitidine, but then worsened when she stopped the ranitidine, increased her carb intake, and decreased her activity level. She was doing better at her last visit, but is  doing worse now, presumable to drinking more [facility].     11. Fatigue, other. She is no longer tired.   12. Hypertension: Her BP had increased in December 2019. Her hypertension is evident again today. She also had  family history of hypertension in both parents. At her May 2021 visit I felt that it was time to exercise consistently or to begin antihypertensive treatment. Because her BP is normal at other times, and because lisinopril cause coughing, she has decided to discontinue lisinopril treatment. She is not ready to accept any other antihypertensive at this point.   PLAN:  1. Diagnostic: Repeat TFTs and TSI in 2 months. .  Repeat PTH, calcium, and vitamin D  in 6 months.   2. Therapeutic: Continue the Synthroid dose of 100 mcg/day for 6 days each week, but add 1/2 tablet on Sundays. Continue Biotech weekly. Continue One-A-Day for Women daily. Increase Citracal 500-600 mg tablets to one tablet per day on 4 days per week. Eat Right and exercise daily. 3. Patient education: We discussed issues of Graves' Dz, Hashimoto's Dz, exophthalmos, and the need to adjust her Synthroid doses accordingly. We discussed vitamin D intake, calcium intake, and secondary hyperparathyroidism. We also discussed hypertension.  4. Follow-up: 3 months  Level of Service: This visit lasted in excess of 65 minutes. More than 50% of the visit was devoted to counseling.  Tillman Sers, MD, CDE Adult and Pediatric Endocrinology 07/01/2021 9:55 AM

## 2021-07-01 NOTE — Patient Instructions (Signed)
Follow up visit in 3 months. Please repeat lab tests in 2 months.  At [facility], we are committed to providing exceptional care. You will receive a patient satisfaction survey through text or email regarding your visit today. Your opinion is important to me. Comments are appreciated.

## 2021-09-05 DIAGNOSIS — E0500 Thyrotoxicosis with diffuse goiter without thyrotoxic crisis or storm: Secondary | ICD-10-CM | POA: Diagnosis not present

## 2021-09-07 LAB — TSH: TSH: 1.71 mIU/L

## 2021-09-07 LAB — T3, FREE: T3, Free: 2.6 pg/mL (ref 2.3–4.2)

## 2021-09-07 LAB — THYROID STIMULATING IMMUNOGLOBULIN: TSI: <89 % baseline (ref <140)

## 2021-09-07 LAB — T4, FREE: Free T4: 1.1 ng/dL (ref 0.8–1.8)

## 2021-09-22 DIAGNOSIS — R7309 Other abnormal glucose: Secondary | ICD-10-CM | POA: Diagnosis not present

## 2021-09-22 DIAGNOSIS — R7989 Other specified abnormal findings of blood chemistry: Secondary | ICD-10-CM | POA: Diagnosis not present

## 2021-09-22 DIAGNOSIS — Z01419 Encounter for gynecological examination (general) (routine) without abnormal findings: Secondary | ICD-10-CM | POA: Diagnosis not present

## 2021-09-22 DIAGNOSIS — Z30431 Encounter for routine checking of intrauterine contraceptive device: Secondary | ICD-10-CM | POA: Diagnosis not present

## 2021-09-22 DIAGNOSIS — Z1231 Encounter for screening mammogram for malignant neoplasm of breast: Secondary | ICD-10-CM | POA: Diagnosis not present

## 2021-10-03 ENCOUNTER — Encounter (INDEPENDENT_AMBULATORY_CARE_PROVIDER_SITE_OTHER): Payer: Self-pay

## 2021-10-06 NOTE — Progress Notes (Signed)
Subjective:  Patient Name: Jessica Maxwell Date of Birth: 08/28/1976  MRN: [id number]  Jessica Maxwell  presents for her clinic visit today for follow-up of her hypothyroidism s/p I-131 therapy for Graves' disease, exophthalmos, goiter, Hashimoto's thyroiditis, fatigue, dyspepsia, hirsutism, hypocalcemia, vitamin D deficiency, and secondary hyperparathyroidism.  HISTORY OF PRESENT ILLNESS:   Jessica Maxwell is a 45 y.o. African-American woman.  Jessica Maxwell was unaccompanied.  1. The patient was referred to me on 11/07/2005 by Dr. Milagros Evener of the [facility] for evaluation and management of  thyrotoxicosis caused by Graves' disease, exophthalmos, weight loss, tachycardia, and tremor. The patient was then 45 years old.  A. The patient had begun to develop symptoms and signs of thyrotoxicosis at age 46-19. By the Summer of  2006 she had begun to miss menstrual periods. She saw Dr. Radene Ou in October 2006. Dr. Radene Ou diagnosed Graves disease clinically and ordered lab tests. On 08/28/05 the TSH was 0.022. Free T4 was 6.28. Dr. Radene Ou started the patient on methimazole, which reduced the free T4 to 1.6 by 09/19/05, but the patient developed an extensive rash on day 21 of therapy, so Dr. Radene Ou appropriately discontinued the medication on 09/22/05.  Thyroid US on 09/04/05 showed a 10x16 mm nodule in the right superior pole. Echotexture in both lobes was inhomogeneous. Radioactive iodine uptake on 10/06/05 was 79% (normal 15-30%). The previously identified "nodule" had avid uptake, similar to the uptake in the remainder of the thyroid gland. The radiologic impression was: "findings compatible with diffuse toxic goiter". Dr. Radene Ou also treated her with atenolol, 50 mg/day.  B. At the time the patient first saw me, she was quite hyperthyroid again. Her past medical history was remarkable only for a unilateral salpingo-oophorectomy for an ectopic pregnancy. Her family history was positive for a paternal  grandfather who had been diagnosed with Graves' disease and exophthalmos and a maternal aunt who had been diagnosed with hypothyroidism.  C. On physical exam, her weight was 125 pounds, her BP was 130/88. Her heart rate was 96. Her eyes were very prominent and she had both inferior proptosis and stare. She had 1+ tongue tremor and 2+ carotid/thyroid bruits. Her thyroid gland was 30-35 grams in size and had a lobulated, firm consistency. She had a grade III/VI systolic flow murmur. She also had 1+ tremor of her outstretched fingers. TSH was.020. Free T4 was 5.0. T3 was 816 (normal 60-181). TPO antibody was 3,873.2 (normal <40-60). TSI was 3.1 (normal < 1.3). Alkaline phosphatase was 179 (normal 39-117), consistent with increased bone turnover due to hyperthyroidism.   D. The patient had a combination of both Graves' Disease with exophthalmos and Hashimoto's Thyroiditis. Her Graves' disease was clearly dominant. Due to her allergic reaction to methimazole, it was quite likely that she might have a similar reaction to propylthiouracil (PTU). I discussed the advantages and disadvantages with her of PTU therapy, I-131 therapy, and thyroidectomy. I recommended I-131 therapy and she concurred. On 01/26/06 she was treated with 12.55 mCi of I-131. Unfortunately, that dose did not destroy enough thyrocytes so she required a second dose of 26.3 mCi of I-131 on 10/12/06.  By 0/18/08 she was hypothyroid, with a TSH of 19.9, free T4 of 0.46, and free T3 of 1.2. I started her on a daily dose of 88 mcg of Synthroid at that time.   2. Clinical course: During the last 15 years we have worked with Jessica Maxwell about several issues. For a detailed discussion of these issues see my encounter note from 12/21/17:  A. Jessica Maxwell's thyroid hormone levels have fluctuated based upon flare ups of her Graves disease activity, flare ups of her Hashimoto's disease activity, her compliance with taking Synthroid, and her absorption of the Synthroid. As a  result we have adjusted her Synthroid doses many time.  B. Her Graves' disease exophthalmos has improved over time, but persists. When her allergies act up she has more conjunctival erythema and irritation than a person without exophthalmos would have.   C. She has had problems several times with secondary hyperparathyroidism due to vitamin D deficiency and hypocalcemia due to inadequate calcium intake.   D. She has gradually gained more weight over time, become mildly obese, and developed dyspepsia.   E. She has her covid vaccinations.  F. On 07/12/20 she was admitted to the [facility] for acute abdominal pain in the setting of a large right ovarian cyst. She was noted on CT scan to have a large fibroid uterus and a heterogeneous hypodense right adnexal mass. Her hCG was positive for pregnancy. Her course was c/w an early missed spontaneous abortion or a ruptured ovarian cyst.   G. On 10/06/20 she had an IUD inserted. She then had intermittent bleeding for 4 months.  H. Prior to her visit on 07/01/21 she  stopped the lisinopril because of coughing.  3. The patient's last [facility] televisit was on 07/01/21. At that visit I continued her Synthroid dose of 100 mcg/day for 6 days per week and 1/2 tablet on the seventh days. I asked her to take one Citracal tablets of 500-600 mg strength twice weekly, but she is reluctant to do so because she does not want to be constipated. I also continued her Biotech once weekly.  Her BPs at home have been 120-130/70s  A. In the interim she has been healthy. She no longer feels tired. Her temperature sensitivity is normal. She only gets about 4 hours of sleep. She always gets up in the middle of the night, but not due to nocturia. Sometimes she wakes up due to dreams. She is again taking in quite a lot of caffeine. She is no longer irritable and anxious.    B.  The IUD is still in place. She no longer has intermittent spotting.   C. Her allergies continue, but are not as bad. She  does not take any allergy medications.   D. 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 She is also still working at [employer].  E. She is busy, but has not been exercising much. She has not changed her eating.   F. Her weight has increased 4 pounds.  G. She is taking her Synthroid in the same way at 5:30 AM. She takes her calcium around dinner time when she takes it. She takes her once weekly vitamin D at dinner.   4. Review of Systems: She has been healthy.  Constitutional: She feels "good". Energy level is "good".  She is not jittery. She is having more headaches in the mornings, presumably doe to caffeine.   Eyes: Eyes are about the same. She wears her glasses or contacts all the time now. She says that her eye look about the same to her when she looks in the mirror. She has no complaints of ocular pressure with "up and out" gaze in either eye. There are no significant eye complaints.  Neck: She has no complaints of anterior neck swelling, soreness, tenderness,  pressure, discomfort, or difficulty swallowing.  Heart: Her HR has usually been normal. Heart rate increases with exercise or other physical activity. She has no complaints of palpitations, irregular heat beats, chest pain, or chest pressure. Gastrointestinal: She has more belly hunger. Her carb intake has remained fairly stable.  The patient has no complaints of stomach aches or pains, diarrhea, or constipation. Hands: She has more tremor.  Legs: Muscle mass and strength seem normal. She no longer has posterior calf pains.   There are no complaints of numbness, tingling, burning, or pain. No edema is noted. Feet: There are no obvious foot problems. There are no complaints of numbness, tingling, burning, or pain. No edema is noted. GYN: LMP was on September 2022. Her GYN told her that it is common for her not to have menses with her Mirena IUD.    PAST MEDICAL,  FAMILY, AND SOCIAL HISTORY:  Past Medical History:  Diagnosis Date   Fatigue    Hypothyroid    Hypothyroidism following radioiodine therapy    Menometrorrhagia    Secondary oligomenorrhea    Thyroiditis, autoimmune    Thyrotoxicosis with diffuse goiter    Toxic diffuse goiter with exophthalmos     Family History  Problem Relation Age of Onset   Heart disease Father    Cancer Father        small bowel cancer    Thyroid disease Maternal Aunt        hypothyroid   Heart disease Maternal Aunt    Heart disease Paternal Aunt    Thyroid disease Paternal Grandfather        hyperthyroid   Heart disease Paternal Grandfather    Diabetes Neg Hx      Current Outpatient Medications:    levonorgestrel (MIRENA) 20 MCG/DAY IUD, Mirena 20 mcg/24 hours (7 yrs) 52 mg intrauterine device  Take 1 device by intrauterine route., Disp: , Rfl:    SYNTHROID 100 MCG tablet, Take 1 tablet 6 days a week. None on the 7th day, Disp: 80 tablet, Rfl: 1   ergocalciferol (VITAMIN D2) 1.25 MG (50000 UT) capsule, Vitamin D2 1,250 mcg (50,000 unit) capsule  Take 1 capsule every week by oral route. (Patient not taking: Reported on 07/01/2021), Disp: , Rfl:    lisinopril (ZESTRIL) 2.5 MG tablet, TAKE 1 TABLET BY MOUTH EVERY DAY (Patient not taking: Reported on 07/01/2021), Disp: 30 tablet, Rfl: 11   Vitamin D, Ergocalciferol, (DRISDOL) 50000 units CAPS capsule, Take 50,000 Units by mouth every Sunday.  (Patient not taking: Reported on 10/07/2021), Disp: , Rfl:   Allergies as of 10/07/2021 - Review Complete 10/07/2021  Allergen Reaction Noted   Lisinopril Cough 07/01/2021   Tapazole [methimazole] Hives 04/20/2011    1. 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 She loves her job. She moved back to [place] and lives with her boyfriend of 6 years. She no longer attends [employer] for her BSN/RN degree.  2. Activities: She walks a lot at work.    3.  Smoking, alcohol, or drugs: None 4. Primary Care Provider: Dr. Briscoe Deutscher at [facility] [place], fax [phone] 5. OB-GYN: [facility] [place]: There are no other significant problems involving Jacole's other body systems.   Objective:  Vital Signs:  BP 122/74 (BP Location: Right Arm, Patient Position: Sitting, Cuff Size: Normal)   Pulse 84   Wt 168 lb 3.2  oz (76.3 kg)   BMI 31.01 kg/m    Ht Readings from Last 3 Encounters:  07/13/20 5' 1.75" (1.568 m)  11/05/18 5' 2.01" (1.575 m)  04/26/18 5' 2.6" (1.59 m)   Wt Readings from Last 3 Encounters:  10/07/21 168 lb 3.2 oz (76.3 kg)  07/01/21 164 lb (74.4 kg)  03/25/21 153 lb 12.8 oz (69.8 kg)   HC Readings from Last 3 Encounters:  No data found for [facility]   Body surface area is 1.82 meters squared.  Facility age limit for growth percentiles is 20 years. Facility age limit for growth percentiles is 20 years.   Constitutional: The patient looks healthy and appears physically and emotionally well, but overweight/obese. She is alert and bright. Her affect and insight are normal. She looks good. Her outfit today is beautifully color-coordinated. She has gained 4 pounds in 4 months. She does not appear overtly physically or emotionally hyperthyroid.  Eyes: There is no arcus or proptosis. Her eyes are not prominent per se, but the left eye is a bit more prominent than the right. There is no lid lag or decreased range of motion.  Mouth: The oropharynx appears normal. The tongue appears normal. There is normal oral moisture. There is no obvious gingivitis. There is no tongue tremor.  Neck: There are no bruits present. The thyroid gland appears normal in size. The thyroid gland is a bit more enlarged today at about 21 grams in size. The lobes are symmetrically enlarged. enlarged. The consistency of the thyroid gland is normal, but fuller, which favors Hashimoto's disease.There is no thyroid  tenderness to palpation. Lungs: The lungs are clear. Air movement is good. Heart: The heart rhythm and rate appear normal. Heart sounds S1 and S2 are normal. I do not appreciate any pathologic heart murmurs. Abdomen: The abdomen is mildly enlarged. Bowel sounds are normal. The abdomen is larger, soft, and non-tender. There is no obviously palpable hepatomegaly, splenomegaly, or other masses.  Arms: Muscle mass appears appropriate for age.  Hands: There is no obvious tremor. Phalangeal and metacarpophalangeal joints appear normal. Palms are normal. There is no palmar erythema.  Legs: Muscle mass appears appropriate for age. There is no edema.  Neurologic: Muscle strength is normal for age and gender  in both the upper and the lower extremities. Muscle tone appears normal. Sensation to touch is normal in the legs.    LAB DATA:  Labs 09/05/21: TSH 1.71, free T4 1.1, free T3 2.6; TSI <89  Labs 06/22/21: TSH 5.96,  free T4 1.5, free T3 2.4; CMP normal; PTH 53 (ref 16-77), calcium 9.3, 25-OH vitamin D 79  Labs 03/21/21: TSH 0.20, free T4 1.6, free T3 2.8; CMP normal; PTH 73 (ref 16-77), calcium 9.2 (ref 8.6-10.2), 25-OH vitamin D 73;   Labs 09/10/20: TSH 0.89, free T4 1.3, free T3  2.7; 25-OH vitamin D 53  Labs 07/12/20: CMP normal; CBC normal, except WBC 11,000; hCG 443.5 (ref <5)  Labs 06/23/20: TSH 1.15, free T4 1.3, free T3 2.8, TSI <89; PTH 53, calcium 9.6, 25-OH vitamin D 40  Labs 05/14/20: TSH 5.86, free T4 1.1, free T3 2.2; PTH 40 (ref 14-64), calcium 9.5 (ref 8.6-10.2), 25-OH vitamin D 51  Labs 02/02/20: TSH 3.73, free T4 1.1, free T3 2.3, TSI <89  Labs 12/05/19: TSH 0.21, free T4 1.3, free T3 2.9; PTH 63, calcium 9.5  Labs 11/01/18: TSH 1.09, free T4 1.3, free T3 2.6; PTH 56, calcium 9.8, 25-OH vitamin D 47  Labs 04/14/18:  TSH 1.06, free T4 1.2, free T3 2.6; PTH 29, calcium 9.6, 25-OH vitamin D 71  Labs 12/15/17: TSH 1.75, free T4 1.4, free T3 2.7; CMP normal; 25-OH vitamin D 70, calcium  8.9, PTH 66 (ref 14-64)  Labs 07/06/17: TSH 1.35, free T4 1.4, free T3 3.2; iron 101 (ref 40-190), CBC normal   Labs 03/30/17: TSI 114 (ref <140), TSH 0.82, free T4 1.4, free T3 2.6; calcium 9.7, PTH 47, 25-OH vitamin D 57  Labs 01/02/17: TSH 4.83, free T4 1.0, free T3 2.2, TSI 147 (ref <140)  Labs 10/23/16: PTH 83 (ref 14-64), calcium 9.3, 25-OH vitamin D 10 (ref 30-100)  Labs 10/11/16: TSH 0.74, free T4 1.2, free T3 2.7, TSI 147  Labs 07/07/16: TSH 1.08, free T4 1.2, free T3 2.9, TSI < 89  Labs 04/13/16: TSH 3.54, free T4 1.30, free T3 2.4  Labs 12/17/15: TSH 2.35, free T4 1.40, free T3 2.7, TSI 94  Labs 10/04/15: TSH 4.290, free T4 1.15, free T3 2.4, TSI 62 (ref <140)  Labs 03/30/15:TSH 0.382, free T4 1.29, free T3 3.1  Labs 08/19/14: TSH 1.027, free T4 1.07, free T3 2.6  Labs 05/20/14: TSH 0.736, free T4 1.34, free T3 3.1,   Labs 10/20/13; TSH 2.414, free T4 1.2, free T3 2.8  Labs 08/05/13: TSH 8.221, free T4 1.18, free T3 2.5. She may have missed the half-dose last Sunday.  Labs 04/25/13: TSH 0.225, free T4 1.30, free T3 2.8  Labs 02/17/13: TSH 3.069, free T4 1.27, free T3 2.5  Labs 08/06/12: TSH 3.072, free T4 1.22, free T3 2.7  Labs 12/12/11: TSH 2.456, free T4 1.49, Free T3 2.7, FSH 2.9, LH 3.5, TSI 82 (<140%),     Assessment and Plan:   ASSESSMENT:  1-3. Hypothyroid, s/p I-131 therapy for Graves' disease on 01/26/06 and 10/12/06/ Graves' Dz/Hashimoto's thyroiditis    A. Patient's TFTs and TSI levels have continued to fluctuate over time. We have had to adjust her Synthroid doses many timed in the past 15 years to compensate for the fluctuations in her TSI and TFT levels.   B. In January 2021 she was hyperthyroid. Her hyperthyroidism with tremor was likely due to a recurrence of Graves' disease. In such a circumstance, we needed to reduce Synthroid hormone as much as necessary to keep her TSH in the goal range of 1.0-2.0.   C. In March 2021 she was euthyroid according to the  lab's reference range, but her TSH was above 3.4, which is the value accepted by many thyroidologists for the true physiologic upper limit of normal above. In July her TSH was higher and frankly elevated. We increased her Synthroid dosage at that time.   D. Her TFTs in November 2021 were quite good. She was clinically euthyroid.  E. In May 2022 she was mildly hyperthyroid clinically and chemically. Her free T4 was much higher and her free T3 was a bit higher. Consequently her TSH was much lower. We reduced her Synthroid to one tablet per day for 6 days each week.   F. The two most likely causes of her hyperthyroidism were;   1). She may have been  having more endogenous Graves' disease activity.   2). The IUD may have resulted in her having a lower estrogen level, so her TBG may be lower and her free thyroid hormone levels may be higher as a result.  G. In August, 2022, she was hypothyroid according to her TSH. She needed more thyroid hormone. In October 2022  her TFTs were mid-euthyroid. She is clinically euthyroid at today's visit in December 2022.   H. Her Graves Dz is still fighting with her 73 Dz for dominance, with the background of being hypothyroid due to her radioactive iodine therapy. On most days the Hashimoto's disease is her major factor. We are doing our best to keep her Synthroid doses appropriate to her needs 4. Goiter: Her thyroid gland had shrunk back to normal size in December 2019 and was still normal in size in November 2021. Her thyroid gland in May 2022 was mildly enlarged and was softer, favoring Graves' disease. Her goiter in August 2022 was still enlarged, but the right lobe had shrunk back to normal size. Her right lobe and her overall thyroid gland were larger in December 2022. The waxing and waning of thyroid gland size is c/w evolving Hashimoto's disease. Her thyroiditis appeared to be clinically quiescent.  5. Exophthalmos: Her left eye is still somewhat more prominent  than the right eye, but appears essentially unchanged from her last several visits and is well within normal limits. There are no eye proptosis, lid lag, or restriction of extra-ocular muscle movements in either eye today. Her exophthalmos parallels her TSI activity.  She does not need Tepezza at this time. 6. Oligomenorrhea and menometrorrhagia: Resolved 7. Overweight/unintentional weight loss: She had lost a great deal of weight, probably due to hyperthyroidism. She has re-gained a great deal of weight due to hypothyroidism, diet, and lack of exercise.  8-9. Vitamin D deficiency/secondary hyperparathyroidism/relative hypocalcemia:   A. Her December 2017 labs showed that she was hyperparathyroid secondary to vitamin D deficiency disease. Since starting Biotech, however, her lab tests in May 2018 showed that her vitamin D level had normalized and her PTH level had normalized.   B. Her February 2019 labs show that her vitamin D was in the middle of the normal range, but her PTH was slightly elevated and her calcium was still low normal, despite the effect of the increased PTH to cause resorption of calcium from her bone stores. These labs show that she was not taking in enough calcium, so she had secondary hyperparathyroidism, secondary then to inadequate calcium intake. She needed at least one good calcium pill per day. The Citracal form of calcium citrate was preferred. I asked her to continue the One-A-Day for Women.   C. Her labs in June 2019 showed that despite taking the Citracal only about twice a week, her calcium had increased and her PTH had decreased as expected physiologically. In December 2019, however, her vitamin D decreased and her PTH increased, all c/w missing too many doses of vitamin D and calcium.   D. Her labs in January 2021 showed a higher PTH and lower calcium after completely stopping her calcium intake due to constipation. I asked her to resume taking calcium and add Miralax or  Metamucil as needed. In August 2021 her PTH, calcium, and vitamin D levels were normal.   E. In May 2022 and in August 2022, her PTH was good, her calcium was low-normal, and her vitamin D level was good. She has not been taking enough calcium and needed more calcium. She has been reluctant to take the extra calcium due to prior problems with constipation. 10. Dyspepsia/reflux: These problems had significantly improved after taking ranitidine, but then worsened when she stopped the ranitidine, increased her carb intake, and decreased her activity level. She was doing better at her May visit, but is doing worse now, presumable to drinking  too may caffeinated beverages.     11. Fatigue, other. She is no longer tired.   12. Hypertension: Her BP had increased in December 2019. Her hypertension is evident again today. She also had  family history of hypertension in both parents. At her May 2021 visit I felt that it was time to exercise consistently or to begin antihypertensive treatment. Because her BP is normal at other times, and because lisinopril cause coughing, she had decided to discontinue lisinopril treatment. Her BP is good today.  13. Sleeping difficulties: She is having more early awakening and headaches, probably due to increasing her caffeine level   PLAN:  1. Diagnostic: Repeat TFTs and TSI, PTH, calcium, and vitamin D prior to next visit   2. Therapeutic: Continue the Synthroid dose of 100 mcg/day for 6 days each week, but add 1/2 tablet on Sundays. Continue Biotech weekly. Continue One-A-Day for Women daily. Increase Citracal 500-600 mg tablets to one tablet per day on 4 days per week. Eat Right and exercise daily. 3. Patient education: We discussed issues of Graves' Dz, Hashimoto's Dz, exophthalmos, and the need to adjust her Synthroid doses accordingly. We discussed vitamin D intake, calcium intake, and secondary hyperparathyroidism. We also discussed hypertension. Reduce your caffeine intake  by 25% per week until sleep improves and caffeine headaches resolve.  4. Follow-up: 3 months  Level of Service: This visit lasted in excess of 60 minutes. More than 50% of the visit was devoted to counseling.  Tillman Sers, MD, CDE Adult and Pediatric Endocrinology 10/07/2021 10:41 AM

## 2021-10-07 ENCOUNTER — Ambulatory Visit (INDEPENDENT_AMBULATORY_CARE_PROVIDER_SITE_OTHER): Payer: BC Managed Care – PPO | Admitting: "Endocrinology

## 2021-10-07 ENCOUNTER — Other Ambulatory Visit: Payer: Self-pay

## 2021-10-07 ENCOUNTER — Encounter (INDEPENDENT_AMBULATORY_CARE_PROVIDER_SITE_OTHER): Payer: Self-pay | Admitting: "Endocrinology

## 2021-10-07 VITALS — BP 122/74 | HR 84 | Wt 168.2 lb

## 2021-10-07 DIAGNOSIS — R519 Headache, unspecified: Secondary | ICD-10-CM

## 2021-10-07 DIAGNOSIS — E663 Overweight: Secondary | ICD-10-CM

## 2021-10-07 DIAGNOSIS — I10 Essential (primary) hypertension: Secondary | ICD-10-CM

## 2021-10-07 DIAGNOSIS — R5383 Other fatigue: Secondary | ICD-10-CM

## 2021-10-07 DIAGNOSIS — E0500 Thyrotoxicosis with diffuse goiter without thyrotoxic crisis or storm: Secondary | ICD-10-CM | POA: Diagnosis not present

## 2021-10-07 DIAGNOSIS — E063 Autoimmune thyroiditis: Secondary | ICD-10-CM

## 2021-10-07 DIAGNOSIS — E559 Vitamin D deficiency, unspecified: Secondary | ICD-10-CM | POA: Diagnosis not present

## 2021-10-07 DIAGNOSIS — E890 Postprocedural hypothyroidism: Secondary | ICD-10-CM

## 2021-10-07 DIAGNOSIS — E349 Endocrine disorder, unspecified: Secondary | ICD-10-CM | POA: Diagnosis not present

## 2021-10-07 DIAGNOSIS — E211 Secondary hyperparathyroidism, not elsewhere classified: Secondary | ICD-10-CM

## 2021-10-07 DIAGNOSIS — R1013 Epigastric pain: Secondary | ICD-10-CM

## 2021-10-07 DIAGNOSIS — G479 Sleep disorder, unspecified: Secondary | ICD-10-CM

## 2021-10-07 DIAGNOSIS — H0520 Unspecified exophthalmos: Secondary | ICD-10-CM

## 2021-10-07 NOTE — Patient Instructions (Signed)
Follow up visit in 3 months. Please repeat lab tests 1-2 weeks prior. Please reduce your caffeine intake by 25% per week until you are sleeping better and your headaches resolve.

## 2021-11-20 ENCOUNTER — Other Ambulatory Visit (INDEPENDENT_AMBULATORY_CARE_PROVIDER_SITE_OTHER): Payer: Self-pay | Admitting: "Endocrinology

## 2021-11-20 DIAGNOSIS — E890 Postprocedural hypothyroidism: Secondary | ICD-10-CM

## 2021-12-20 IMAGING — US US OB COMP LESS 14 WK
1 series · 13 of 28 positions shown · non-contrast
Comparison: No prior obstetric exams available.

CLINICAL DATA: Known miscarriage. Right lower quadrant pain. Beta
HCG 443

EXAM:
OBSTETRIC <14 WK ULTRASOUND
TECHNIQUE: Transabdominal ultrasound was performed for evaluation of the
gestation as well as the maternal uterus and adnexal regions.

[Series 1: us ob comp less 14 wks · 58 acquisitions, 13 frames shown]
[im 3/58]
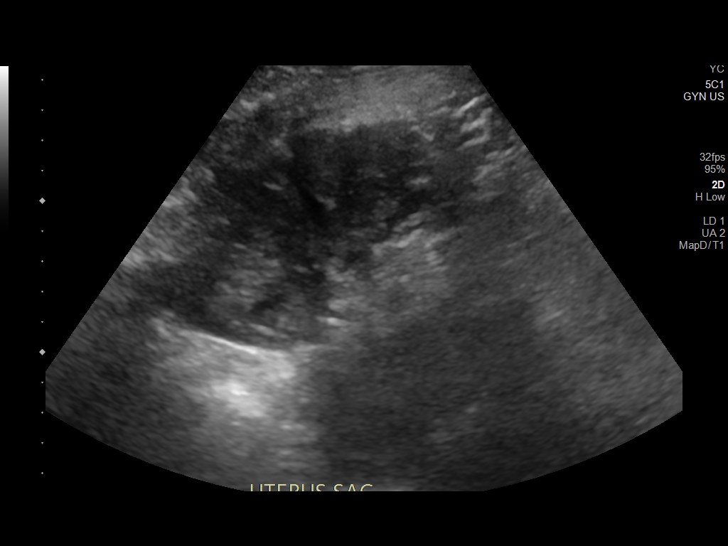
[im 7/58]
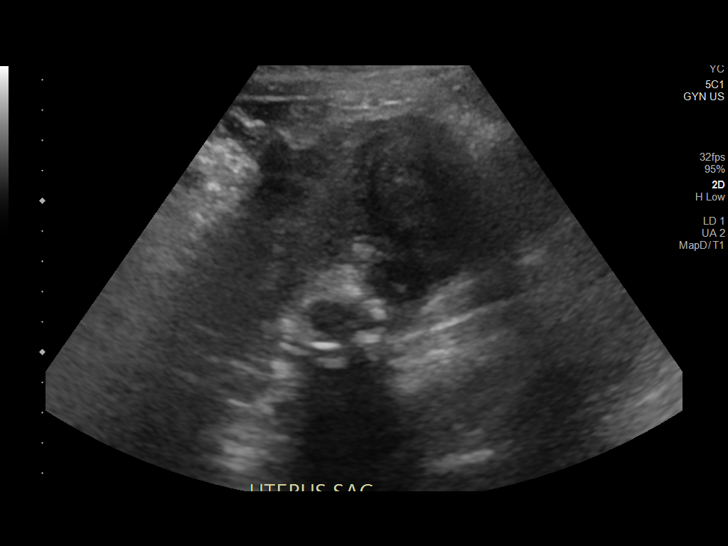
[im 11/58]
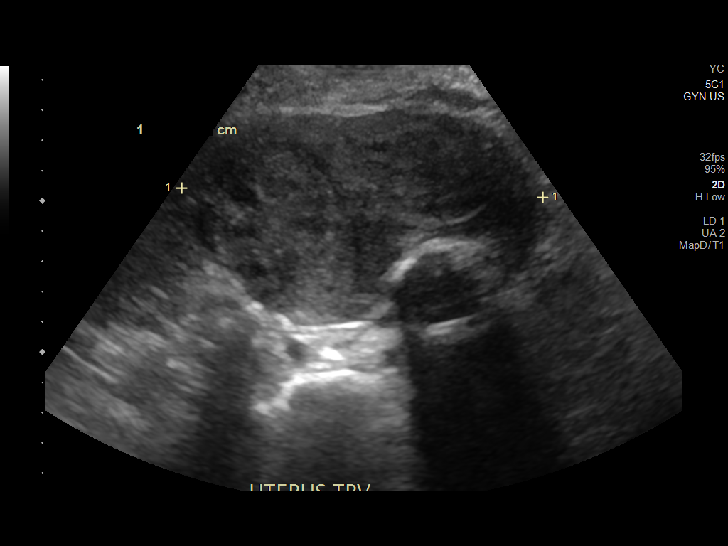
[im 15/58]
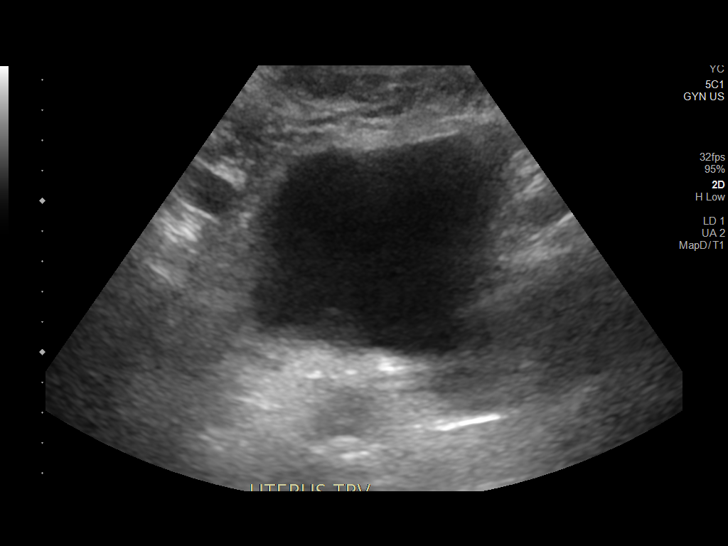
[im 20/58]
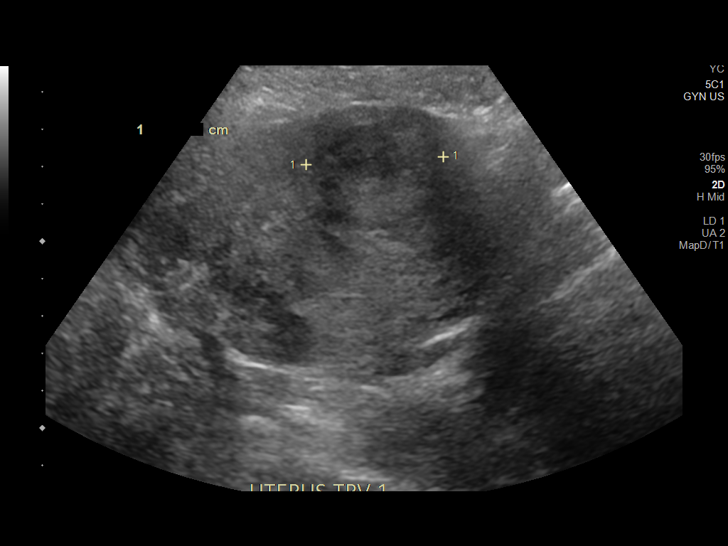
[im 24/58]
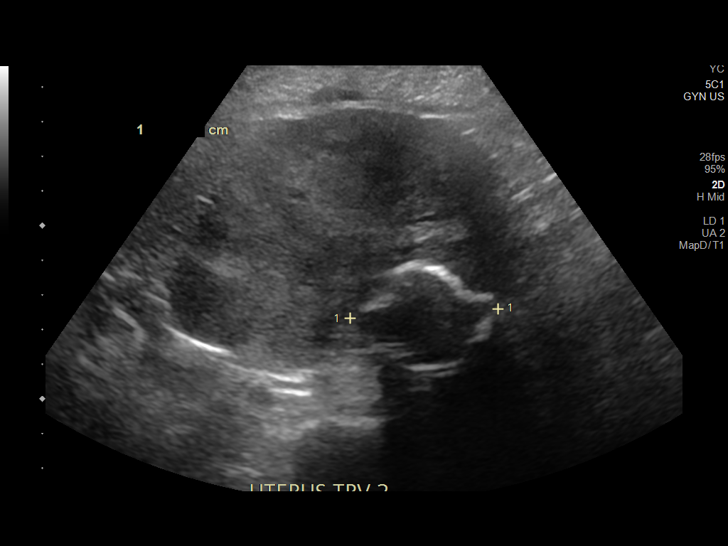
[im 30/58]
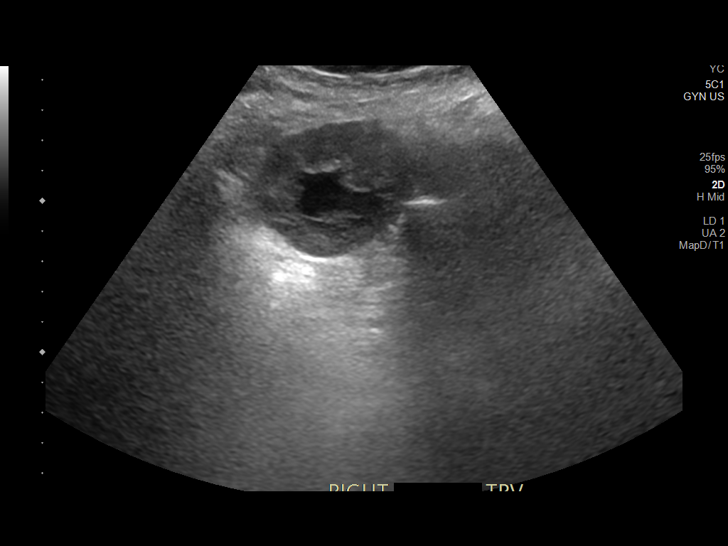
[im 34/58]
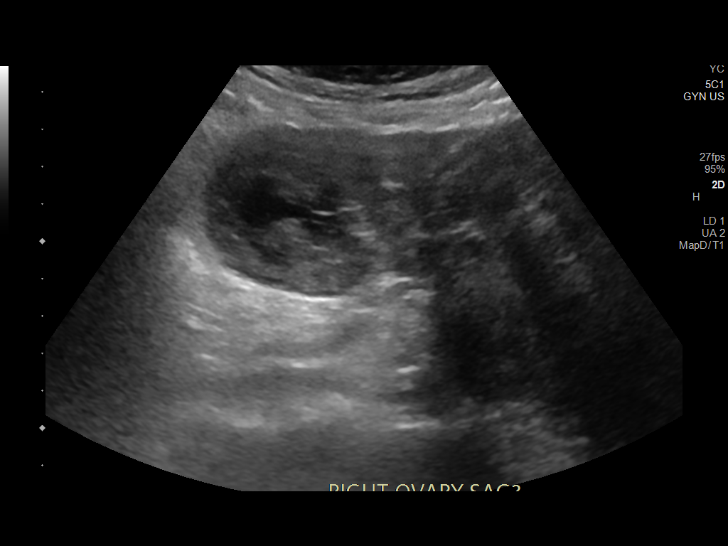
[im 39/58]
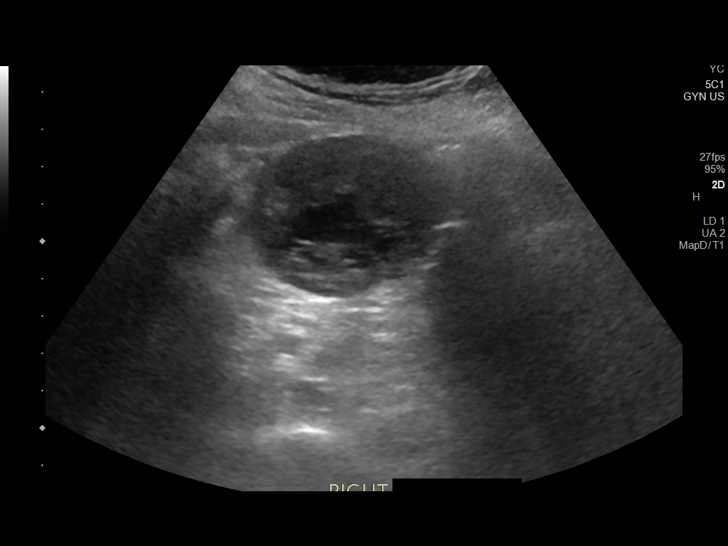
[im 43/58]
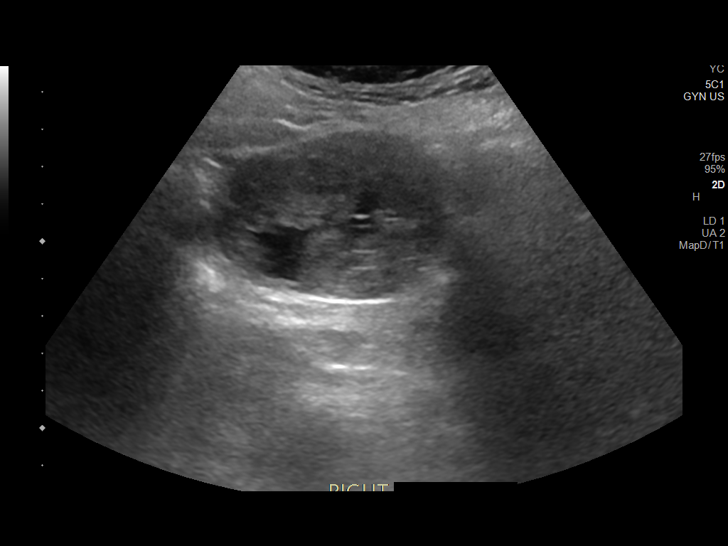
[im 47/58]
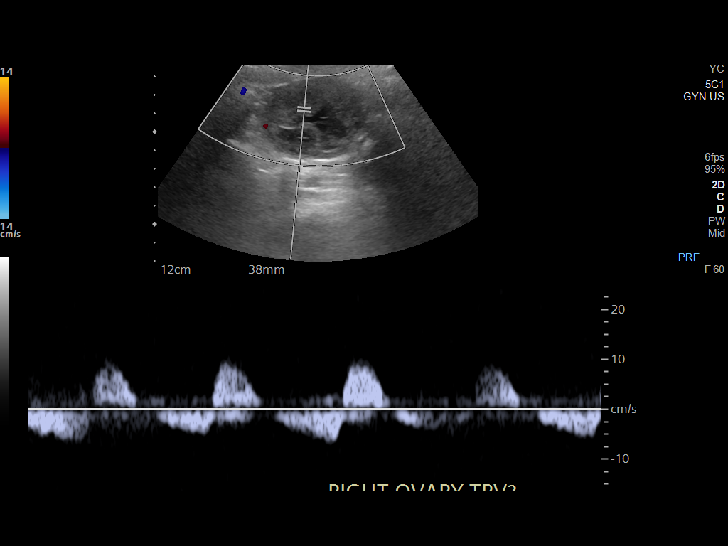
[im 51/58]
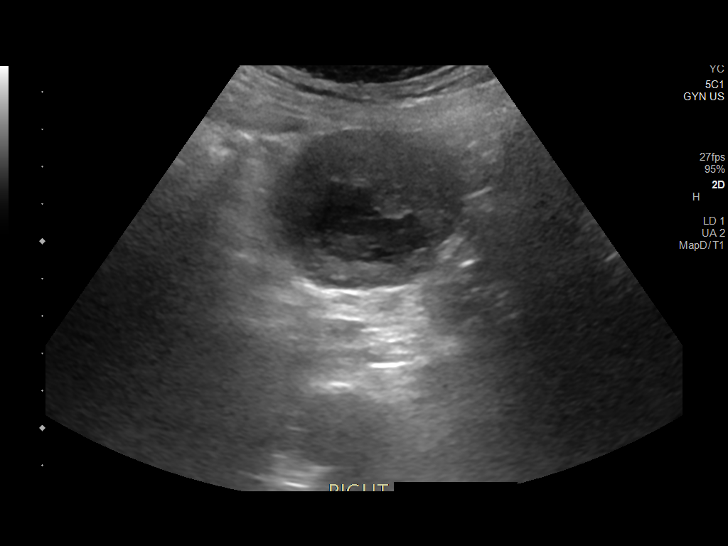
[im 55/58]
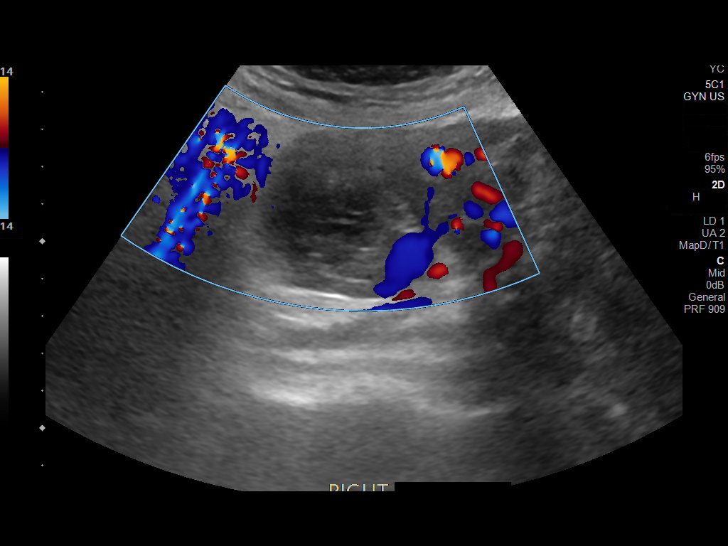

[13 of 28 positions shown; findings below may reference images not displayed]

FINDINGS: Intrauterine gestational sac: None

Yolk sac:  Not Visualized.

Embryo:  Not Visualized.

Cardiac Activity: Not Visualized.

Maternal uterus/adnexae: The uterus is enlarged and heterogeneous
measuring 16.8 x 7.7 x 11.9 cm. There multiple uterine fibroids,
some of which are shadowing/calcified. Anterior fundal fibroid
measures 4.4 x 4.0 x 3.7 cm. Posterior fundal fibroid measures 4.6 x
3.6 x 4.3 cm. This has shadowing calcifications. Right fundal
fibroid measures 3.6 x 3.1 x 3.1 cm. The endometrium is not
well-defined on the current exam due to underlying fibroids which
limits assessment for retained products.

With tentatively represents the right ovary measures 6.0 x 4.4 x
cm. Complex cystic lesion within the structure measures 3.7 x 4.1 x
2.9 cm. Blood flow is demonstrated. Alternatively, this may
represent a pedunculated fibroid. The left ovary is not seen. No
adnexal mass. There is trace free fluid in the right lower quadrant.
IMPRESSION: 1. No intrauterine gestational sac. Enlarged heterogeneous fibroid
uterus. The endometrium is not well-defined due to underlying
fibroids which limits assessment for retained products.
2. Complex right adnexal structure measuring 6.4 cm with a cystic
component. This likely represents the right ovary with a complex
cyst, however the possibility of pedunculated fibroid is also
raised. Recommend correlation with any prior exams if available.
Trending of beta hCG is also recommended.

## 2021-12-20 IMAGING — CT CT ABD-PELV W/ CM
2 of 5 series · 15 of 46 positions shown, 17 images · IV contrast (APPLIED)
Comparison: None.

CLINICAL DATA: Right lower quadrant abdominal pain

EXAM:
CT ABDOMEN AND PELVIS WITH CONTRAST
TECHNIQUE: Multidetector CT imaging of the abdomen and pelvis was performed
using the standard protocol following bolus administration of
intravenous contrast.
CONTRAST:  100mL OMNIPAQUE IOHEXOL 300 MG/ML  SOLN

[Series 4: abd/ pelvis 5.0 i30f 2 · axial · 0.60mm/px · z∈[+868,+1263]mm · 12 of 89 slices shown, 14 images]
[im 5/89  soft-tissue]
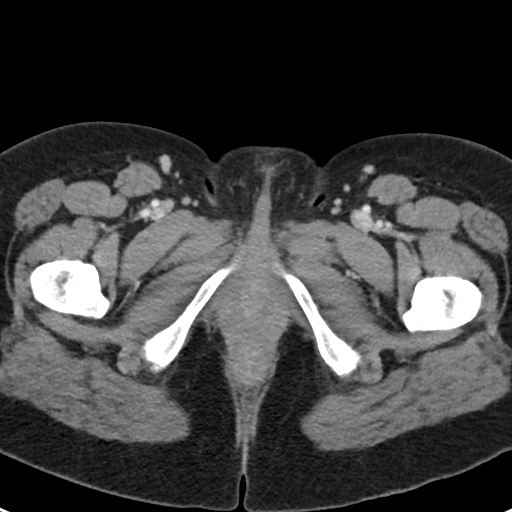
[im 5/89  bone]
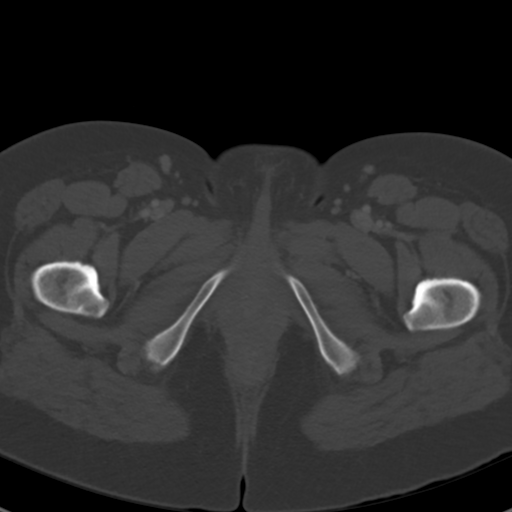
[im 14/89  soft-tissue]
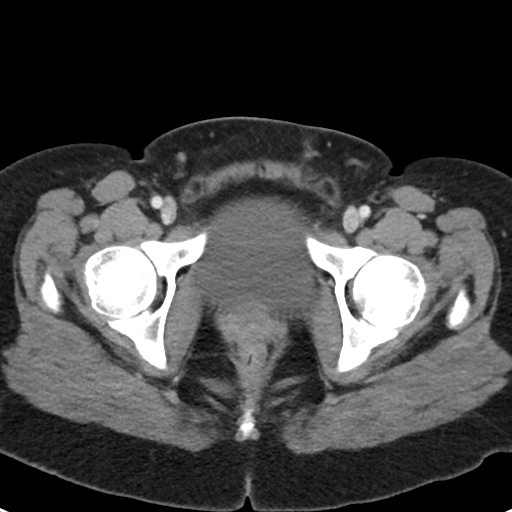
[im 18/89  soft-tissue]
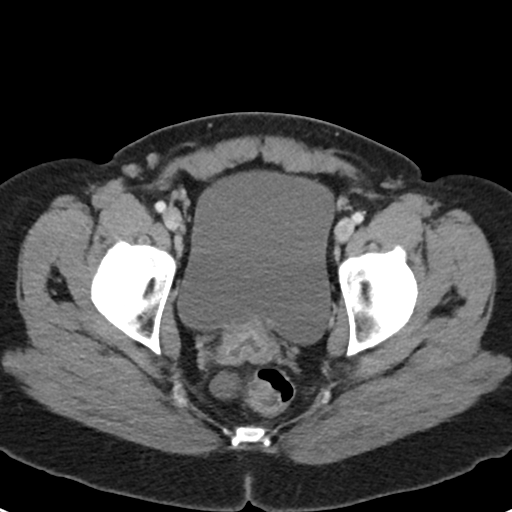
[im 27/89  soft-tissue]
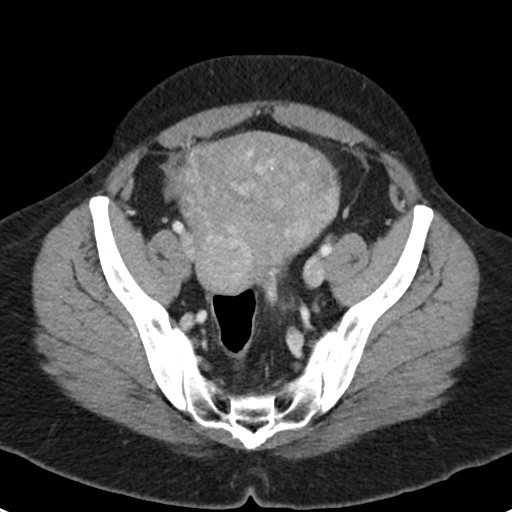
[im 36/89  soft-tissue]
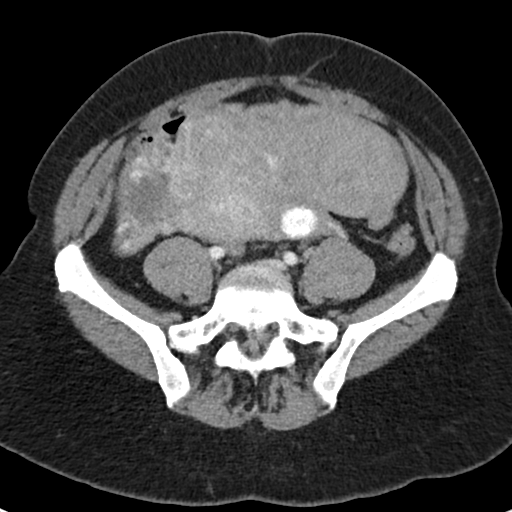
[im 40/89  soft-tissue]
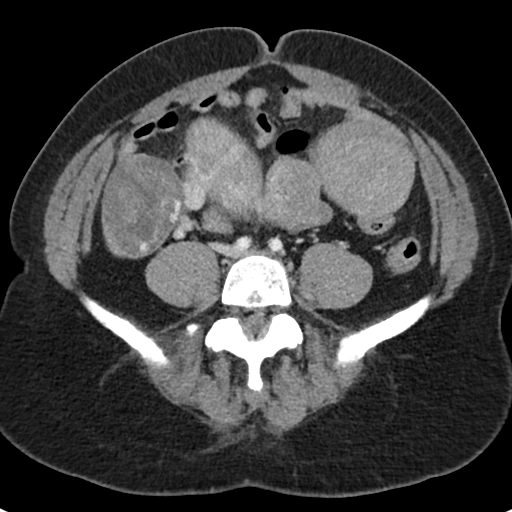
[im 49/89  soft-tissue]
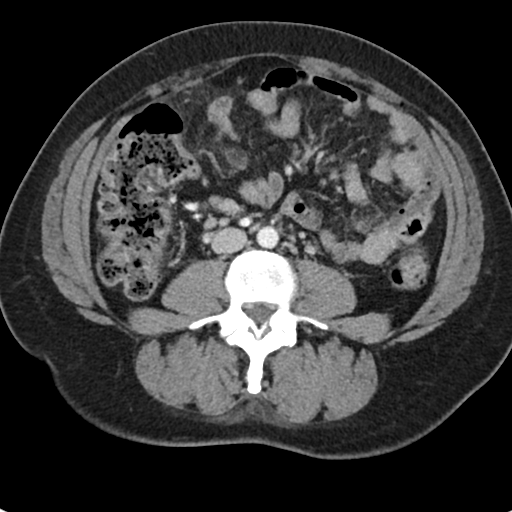
[im 53/89  soft-tissue]
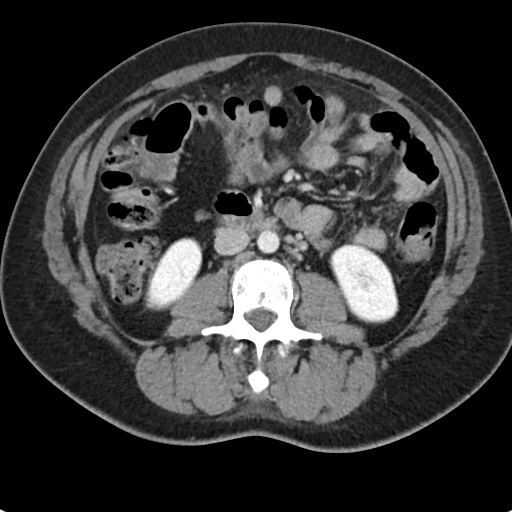
[im 62/89  soft-tissue]
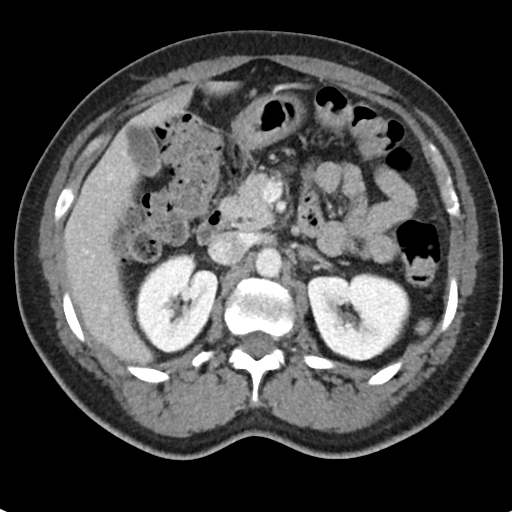
[im 62/89  bone]
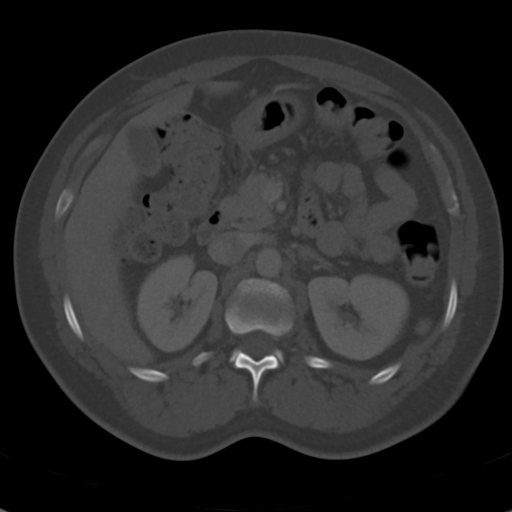
[im 71/89  soft-tissue]
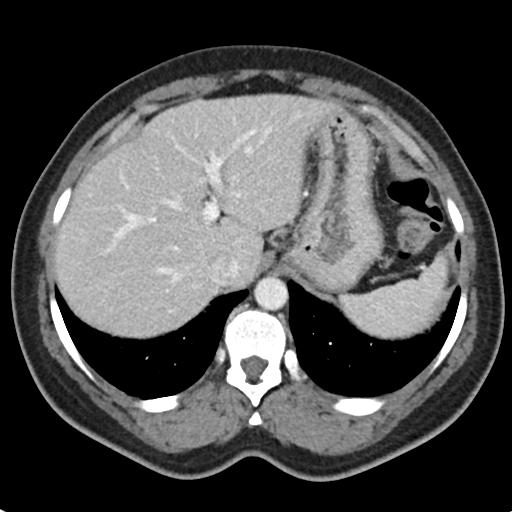
[im 75/89  soft-tissue]
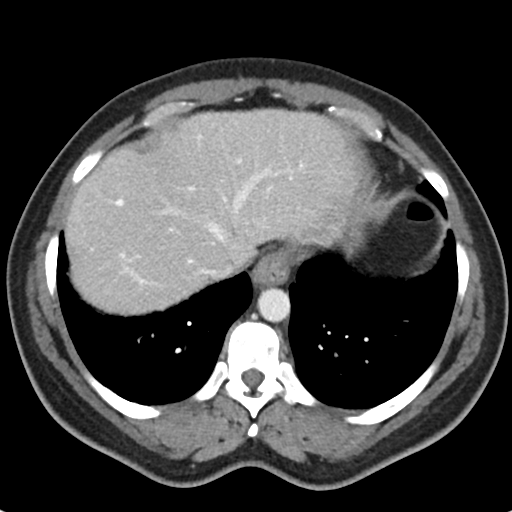
[im 84/89  soft-tissue]
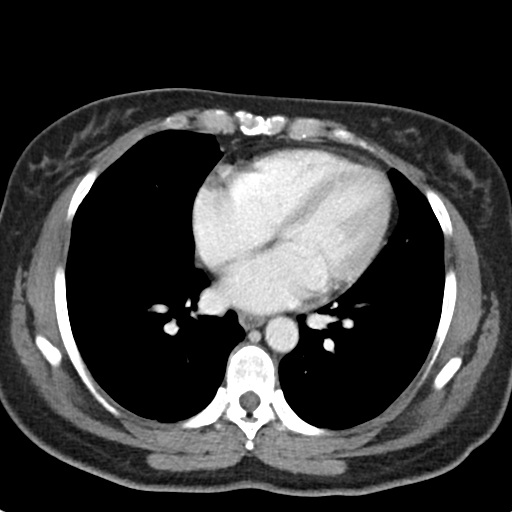

[Series 6: coronal soft tissue · coronal · 0.68mm/px · 3 of 101 slices shown]
[im 34/101  soft-tissue]
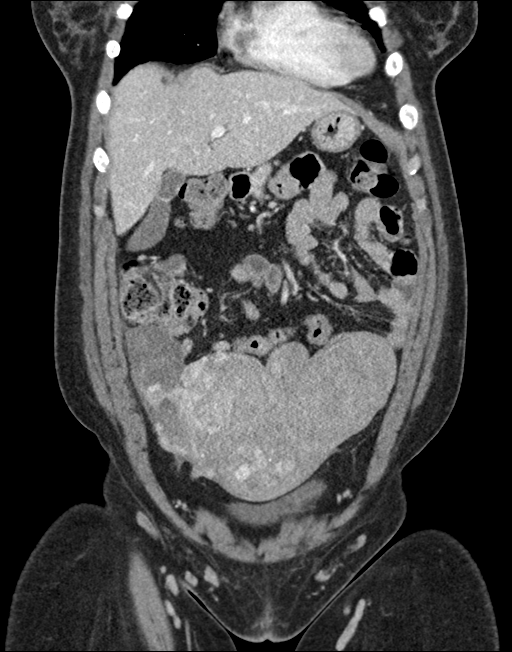
[im 45/101  soft-tissue]
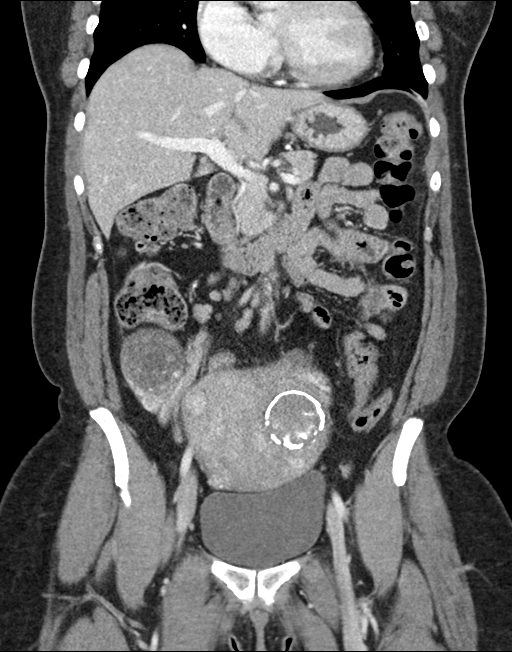
[im 56/101  soft-tissue]
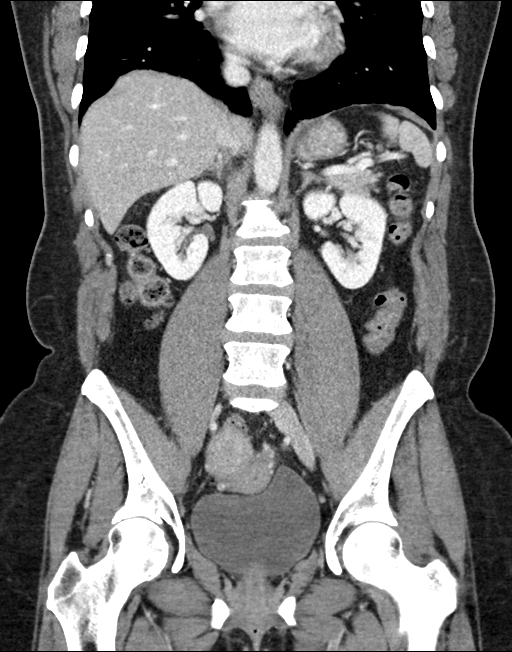

[15 of 46 positions shown; findings below may reference images not displayed]

FINDINGS: Lower chest: The visualized lung bases are clear bilaterally. The
visualized heart and pericardium are unremarkable.

Hepatobiliary: Tiny cyst within the left hepatic lobe. Liver and
gallbladder are otherwise unremarkable. No intra or extrahepatic
biliary ductal dilation.

Pancreas: Unremarkable

Spleen: Unremarkable

Adrenals/Urinary Tract: Adrenal glands are unremarkable. Kidneys are
normal, without renal calculi, focal lesion, or hydronephrosis.
Bladder is unremarkable.

Stomach/Bowel: Stomach, small bowel, and large bowel are
unremarkable save for minimal

Vascular/Lymphatic: The abdominal vasculature is notable for
compression of the common iliac veins at the confluence of the
inferior vena cava likely related to mass effect from the enlarged
uterus.

Reproductive: There is a heterogeneously hypodense mass within the
right adnexa draining to the right ovarian vein likely representing
an enlarged right ovary measuring 6.0 x 4.6 cm in size. There is
trace surrounding mesenteric infiltration in review of the prior
sonogram performed at [DATE] p.m., there is confirmed arterial flow,
however, no definite venous flow is identified and this could
reflect either early or intermittent torsion of the right ovary.
Less likely, this could reflect a torsed broad ligament fibroid.

Multiple uterine fibroids are identified within an enlarged uterus.
The left ovary is unremarkable, best seen on coronal imaging.

Other: Rectum unremarkable

Musculoskeletal: No acute bone abnormality.
IMPRESSION: 1. Heterogeneously hypodense mass within the right adnexa draining
to the right ovarian vein, likely representing an enlarged right
ovary measuring 6.0 x 4.6 cm in size. There is trace surrounding
mesenteric infiltration. This most likely represents either early or
intermittent torsion of the right ovary. Less likely, this could
reflect a torsed broad ligament fibroid. Gynecologic consultation is
recommended.
2. Enlarged fibroid uterus.
3. Compression of the common iliac veins at the confluence of the
inferior vena cava, likely related to mass effect from the enlarged
uterus.
4. These results were called by telephone at the time of
interpretation on 07/12/2020 at [DATE] to provider BA
CONNER , who verbally acknowledged these results.

## 2022-01-01 ENCOUNTER — Other Ambulatory Visit (INDEPENDENT_AMBULATORY_CARE_PROVIDER_SITE_OTHER): Payer: Self-pay | Admitting: "Endocrinology

## 2022-01-01 DIAGNOSIS — E890 Postprocedural hypothyroidism: Secondary | ICD-10-CM

## 2022-01-02 ENCOUNTER — Telehealth: Payer: Self-pay | Admitting: "Endocrinology

## 2022-01-02 LAB — COMPREHENSIVE METABOLIC PANEL WITH GFR
AG Ratio: 1.3 (calc) (ref 1.0–2.5)
ALT: 8 U/L (ref 6–29)
AST: 13 U/L (ref 10–35)
Albumin: 3.9 g/dL (ref 3.6–5.1)
Alkaline phosphatase (APISO): 79 U/L (ref 31–125)
BUN: 9 mg/dL (ref 7–25)
CO2: 27 mmol/L (ref 20–32)
Calcium: 9.5 mg/dL (ref 8.6–10.2)
Chloride: 106 mmol/L (ref 98–110)
Creat: 0.85 mg/dL (ref 0.50–0.99)
Globulin: 2.9 g/dL (ref 1.9–3.7)
Glucose, Bld: 61 mg/dL — ABNORMAL LOW (ref 65–139)
Potassium: 3.9 mmol/L (ref 3.5–5.3)
Sodium: 140 mmol/L (ref 135–146)
Total Bilirubin: 0.5 mg/dL (ref 0.2–1.2)
Total Protein: 6.8 g/dL (ref 6.1–8.1)

## 2022-01-02 LAB — T4, FREE: Free T4: 1.4 ng/dL (ref 0.8–1.8)

## 2022-01-02 LAB — T3, FREE: T3, Free: 2.9 pg/mL (ref 2.3–4.2)

## 2022-01-02 LAB — TSH: TSH: 1.18 mIU/L

## 2022-01-02 LAB — PTH, INTACT AND CALCIUM
Calcium: 9.5 mg/dL (ref 8.6–10.2)
PTH: 59 pg/mL (ref 16–77)

## 2022-01-02 LAB — VITAMIN D 25 HYDROXY (VIT D DEFICIENCY, FRACTURES): Vit D, 25-Hydroxy: 75 ng/mL (ref 30–100)

## 2022-01-02 NOTE — Telephone Encounter (Signed)
°  Who's calling (name and relationship to patient) :Self/ Jessica Maxwell contact number:[phone]   Provider they see:Dr. Tobe Sos   Reason for call:out medication! Pharmacy stated that her insurance is denying the medication. Caller statedc that this happened last time and that Dr. Tobe Sos wrote a letter for her. Caller requested a call back      [place]  Name of prescription:Synthroid   Pharmacy:

## 2022-01-03 ENCOUNTER — Encounter (INDEPENDENT_AMBULATORY_CARE_PROVIDER_SITE_OTHER): Payer: Self-pay

## 2022-01-03 NOTE — Telephone Encounter (Signed)
LVM with call back number.

## 2022-01-03 NOTE — Telephone Encounter (Signed)
Spoke with Jessica Maxwell. She paid out of pocket for this months Synthroid. I told her Im waiting for the insurance company to let me know if she approved or denied. If denied we do have a program she can go through [employer]. She also wanted to reschedule her appointment for Friday. She will call the front to do that.

## 2022-01-06 ENCOUNTER — Ambulatory Visit (INDEPENDENT_AMBULATORY_CARE_PROVIDER_SITE_OTHER): Payer: BC Managed Care – PPO | Admitting: "Endocrinology

## 2022-01-17 ENCOUNTER — Telehealth (INDEPENDENT_AMBULATORY_CARE_PROVIDER_SITE_OTHER): Payer: Self-pay

## 2022-01-17 ENCOUNTER — Encounter (INDEPENDENT_AMBULATORY_CARE_PROVIDER_SITE_OTHER): Payer: Self-pay

## 2022-01-17 ENCOUNTER — Other Ambulatory Visit (INDEPENDENT_AMBULATORY_CARE_PROVIDER_SITE_OTHER): Payer: Self-pay

## 2022-01-17 DIAGNOSIS — E890 Postprocedural hypothyroidism: Secondary | ICD-10-CM

## 2022-01-17 MED ORDER — SYNTHROID 100 MCG PO TABS: ORAL_TABLET | ORAL | 5 refills | Status: DC

## 2022-01-17 NOTE — Telephone Encounter (Signed)
-----   Message from Sherrlyn Hock, MD sent at 01/17/2022  4:16 PM EDT ----- ?Thyroid tests were mid-normal.  ?CMP was normal, but glucose was 61, probably due to blood sitting out overnight. ?PTH was normal. ?Calcium was normal. ?Vitamin D was normal.  ?

## 2022-02-15 NOTE — Progress Notes (Signed)
Subjective:  ?Patient Name: Jessica Maxwell Date of Birth: 09-29-1976  MRN: [id number] ? 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 ? ?HISTORY OF PRESENT ILLNESS:  ? ?Jessica Maxwell is a 46 y.o. African-American woman. ? ?Arlet was unaccompanied. ? ?1. The patient was referred to me on 11/07/2005 by Dr. Milagros Evener of the [facility] for evaluation and management of  thyrotoxicosis caused by Graves' disease, exophthalmos, weight loss, tachycardia, and tremor. The patient was then 46 years old. ? A. The patient had begun to develop symptoms and signs of thyrotoxicosis at age 46-19. By the Summer of  2006 she had begun to miss menstrual periods. She saw Dr. Radene Ou in October 2006. Dr. Radene Ou diagnosed Graves disease clinically and ordered lab tests. On 08/28/05 the TSH was 0.022. Free T4 was 6.28. Dr. Radene Ou started the patient on methimazole, which reduced the free T4 to 1.6 by 09/19/05, but the patient developed an extensive rash on day 21 of therapy, so Dr. Radene Ou appropriately discontinued the medication on 09/22/05.  Thyroid US on 09/04/05 showed a 10x16 mm nodule in the right superior pole. Echotexture in both lobes was inhomogeneous. Radioactive iodine uptake on 10/06/05 was 79% (normal 15-30%). The previously identified "nodule" had avid uptake, similar to the uptake in the remainder of the thyroid gland. The radiologic impression was: "findings compatible with diffuse toxic goiter". Dr. Radene Ou also treated her with atenolol, 50 mg/day. ? B. At the time the patient first saw me, she was quite hyperthyroid again. Her past medical history was remarkable only for a unilateral salpingo-oophorectomy for an ectopic pregnancy. Her family history was positive for a paternal  grandfather who had been diagnosed with Graves' disease and exophthalmos and a maternal aunt who had been diagnosed with hypothyroidism. ? C. On physical exam, her weight was 125 pounds, her BP was 130/88. Her heart rate was 96. Her eyes were very prominent and she had both inferior proptosis and stare. She had 1+ tongue tremor and 2+ carotid/thyroid bruits. Her thyroid gland was 30-35 grams in size and had a lobulated, firm consistency. She had a grade III/VI systolic flow murmur. She also had 1+ tremor of her outstretched fingers. TSH was.020. Free T4 was 5.0. T3 was 816 (normal 60-181). TPO antibody was 3,873.2 (normal <40-60). TSI was 3.1 (normal < 1.3). Alkaline phosphatase was 179 (normal 39-117), consistent with increased bone turnover due to hyperthyroidism.  ? D. The patient had a combination of both Graves' Disease with exophthalmos and Hashimoto's Thyroiditis. Her Graves' disease was clearly dominant. Due to her allergic reaction to methimazole, it was quite likely that she might have a similar reaction to propylthiouracil (PTU). I discussed the advantages and disadvantages with her of PTU therapy, I-131 therapy, and thyroidectomy. I recommended I-131 therapy and she concurred. On 01/26/06 she was treated with 12.55 mCi of I-131. Unfortunately, that dose did not destroy enough thyrocytes so she required a second dose of 26.3 mCi of I-131 on 10/12/06.  By 0/18/08 she was hypothyroid, with a TSH of 19.9, free T4 of 0.46, and free T3 of 1.2. I started her on a daily dose of 88 mcg of Synthroid at that time.  ? ?2. Clinical course: During the last 16 years we have worked with [name] about several issues. For a detailed discussion of these issues see my encounter note from 12/21/17:  ?  A. [name] thyroid hormone levels have fluctuated based upon flare ups of her Graves disease activity, flare ups of her Hashimoto's disease activity, her compliance with taking Synthroid, and her absorption of the Synthroid. As a  result we have adjusted her Synthroid doses many time. ? B. Her Graves' disease exophthalmos has improved over time, but persists. When her allergies act up she has more conjunctival erythema and irritation than a person without exophthalmos would have.  ? C. She has had problems several times with secondary hyperparathyroidism due to vitamin D deficiency and hypocalcemia due to inadequate calcium intake.  ? D. She has gradually gained more weight over time, become mildly obese, and developed dyspepsia.  ? E. She has her covid vaccinations. ? F. On 07/12/20 she was admitted to the [facility] for acute abdominal pain in the setting of a large right ovarian cyst. She was noted on CT scan to have a large fibroid uterus and a heterogeneous hypodense right adnexal mass. Her hCG was positive for pregnancy. Her course was c/w an early missed spontaneous abortion or a ruptured ovarian cyst.  ? G. On 10/06/20 she had an IUD inserted. She then had intermittent bleeding for 4 months. ? H. Prior to her visit on 07/01/21 she stopped the lisinopril because of coughing. ? ?3. The patient's last [facility] clinic visit was on 10/07/21. At that visit I continued her Synthroid dose of 100 mcg/day for 6 days per week and 1/2 tablet on the seventh days. I asked her to take one Citracal tablets of 500-600 mg strength four days per week, but she is reluctant to do so because she does not want to be constipated. I also continued her [employer] once weekly.   ? A. In the interim she has been healthy. She is more tired.She feels warm. She only gets about 4 hours of sleep. She always gets up in the middle of the night, but not due to nocturia. She does not have insomnia, but has early awakening. She often wakes up due to dreams. She is now living alone. She was taking in quite a lot of caffeine, until last week. She is no longer irritable and anxious. She is now living alone.  ? B.  The IUD is still in place. She has intermittent spotting.  ? C. Her  allergies are "terrible'"this year. She does not take any allergy medications.  ? D. 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 She is also still working at [employer]. ? E. She is busy, but has not been exercising much. She has not changed her eating.  ? F. Her weight has decreased 1 pound. ? G. She is taking her Synthroid in the same way at 5:30 AM. She takes her calcium around dinner time when she takes it. She takes her once weekly vitamin D at dinner.  ? ?4. Review of Systems: She has been healthy.  ?Constitutional: She feels "okay-fair". Energy level is lower. She is more tired since stopping caffeine. She still somewhat jittery at times. She is not having headaches now.    ?Eyes: Eyes are about the same. She wears her glasses or contacts all the time now. She says that her eye look about the same to her when she looks in the mirror. She has no complaints of ocular pressure with "up and out" gaze in either eye. There are no significant eye complaints.  ?Neck: She  has no complaints of anterior neck swelling, soreness, tenderness,  pressure, discomfort, or difficulty swallowing.  ?Heart: Her HR has usually been normal. Heart rate increases with exercise or other physical activity. She has no complaints of palpitations, irregular heat beats, chest pain, or chest pressure. ?Gastrointestinal: She has more belly hunger in the mornings. Her carb intake has remained fairly stable.  The patient has no complaints of stomach aches or pains, diarrhea, or constipation. ?Hands: She has more tremor.  ?Legs: Muscle mass and strength seem normal. She no longer has posterior calf pains.   There are no complaints of numbness, tingling, burning, or pain. No edema is noted. ?Feet: There are no obvious foot problems. There are no complaints of numbness, tingling, burning, or pain. No edema is noted. ?GYN: LMP was on September 2022. Her GYN told her that it is  common for her not to have menses with her Mirena IUD.  ?  ?PAST MEDICAL, FAMILY, AND SOCIAL HISTORY: ? ?Past Medical History:  ?Diagnosis Date  ? Fatigue   ? Hypothyroid   ? Hypothyroidism following radioiodine t

## 2022-02-16 ENCOUNTER — Encounter (INDEPENDENT_AMBULATORY_CARE_PROVIDER_SITE_OTHER): Payer: Self-pay | Admitting: "Endocrinology

## 2022-02-16 ENCOUNTER — Ambulatory Visit (INDEPENDENT_AMBULATORY_CARE_PROVIDER_SITE_OTHER): Payer: BC Managed Care – PPO | Admitting: "Endocrinology

## 2022-02-16 VITALS — BP 120/78 | HR 76 | Wt 167.0 lb

## 2022-02-16 DIAGNOSIS — I10 Essential (primary) hypertension: Secondary | ICD-10-CM | POA: Diagnosis not present

## 2022-02-16 DIAGNOSIS — E0500 Thyrotoxicosis with diffuse goiter without thyrotoxic crisis or storm: Secondary | ICD-10-CM

## 2022-02-16 DIAGNOSIS — E349 Endocrine disorder, unspecified: Secondary | ICD-10-CM

## 2022-02-16 DIAGNOSIS — E663 Overweight: Secondary | ICD-10-CM

## 2022-02-16 DIAGNOSIS — G479 Sleep disorder, unspecified: Secondary | ICD-10-CM

## 2022-02-16 DIAGNOSIS — R5383 Other fatigue: Secondary | ICD-10-CM

## 2022-02-16 DIAGNOSIS — R1013 Epigastric pain: Secondary | ICD-10-CM

## 2022-02-16 DIAGNOSIS — E890 Postprocedural hypothyroidism: Secondary | ICD-10-CM

## 2022-02-16 DIAGNOSIS — H0520 Unspecified exophthalmos: Secondary | ICD-10-CM

## 2022-02-16 DIAGNOSIS — E559 Vitamin D deficiency, unspecified: Secondary | ICD-10-CM

## 2022-02-16 DIAGNOSIS — E211 Secondary hyperparathyroidism, not elsewhere classified: Secondary | ICD-10-CM

## 2022-02-16 DIAGNOSIS — E063 Autoimmune thyroiditis: Secondary | ICD-10-CM | POA: Diagnosis not present

## 2022-02-16 NOTE — Patient Instructions (Signed)
Follow up visit in 3 months. Please repeat lab tests 1-2 weeks prior.  ? ?At [facility], we are committed to providing exceptional care. You will receive a patient satisfaction survey through text or email regarding your visit today. Your opinion is important to me. Comments are appreciated. ? ?

## 2022-05-25 NOTE — Progress Notes (Addendum)
Subjective:  Patient Name: Jessica Maxwell Date of Birth: 03/18/1976  MRN: [id number]  Jessica Maxwell  presents for her clinic visit today for follow-up of her hypothyroidism s/p I-131 therapy for Graves' disease, exophthalmos, goiter, Hashimoto's thyroiditis, fatigue, dyspepsia, hirsutism, hypocalcemia, vitamin D deficiency, and secondary hyperparathyroidism.  HISTORY OF PRESENT ILLNESS:   Jessica Maxwell is a 46 y.o. African-American woman.  [name] was unaccompanied.  1. The patient was referred to me on 11/07/2005 by Dr. Milagros Evener of the [facility] for evaluation and management of  thyrotoxicosis caused by Graves' disease, exophthalmos, weight loss, tachycardia, and tremor. The patient was then 46 years old.  A. The patient had begun to develop symptoms and signs of thyrotoxicosis at age 57-19. By the Summer of  2006 she had begun to miss menstrual periods. She saw Dr. Radene Ou in October 2006. Dr. Radene Ou diagnosed Graves disease clinically and ordered lab tests. On 08/28/05 the TSH was 0.022. Free T4 was 6.28. Dr. Radene Ou started the patient on methimazole, which reduced the free T4 to 1.6 by 09/19/05, but the patient developed an extensive rash on day 21 of therapy, so Dr. Radene Ou appropriately discontinued the medication on 09/22/05.  Thyroid US on 09/04/05 showed a 10x16 mm nodule in the right superior pole. Echotexture in both lobes was inhomogeneous. Radioactive iodine uptake on 10/06/05 was 79% (normal 15-30%). The previously identified "nodule" had avid uptake, similar to the uptake in the remainder of the thyroid gland. The radiologic impression was: "findings compatible with diffuse toxic goiter". Dr. Radene Ou also treated her with atenolol, 50 mg/day.  B. At the time the patient first saw me, she was quite hyperthyroid again. Her past medical history was remarkable only for a unilateral salpingo-oophorectomy for an ectopic pregnancy. Her family history was positive for a paternal  grandfather who had been diagnosed with Graves' disease and exophthalmos and a maternal aunt who had been diagnosed with hypothyroidism.  C. On physical exam, her weight was 125 pounds, her BP was 130/88. Her heart rate was 96. Her eyes were very prominent and she had both inferior proptosis and stare. She had 1+ tongue tremor and 2+ carotid/thyroid bruits. Her thyroid gland was 30-35 grams in size and had a lobulated, firm consistency. She had a grade III/VI systolic flow murmur. She also had 1+ tremor of her outstretched fingers. TSH was.020. Free T4 was 5.0. T3 was 816 (normal 60-181). TPO antibody was 3,873.2 (normal <40-60). TSI was 3.1 (normal < 1.3). Alkaline phosphatase was 179 (normal 39-117), consistent with increased bone turnover due to hyperthyroidism.   D. The patient had a combination of both Graves' Disease with exophthalmos and Hashimoto's Thyroiditis. Her Graves' disease was clearly dominant. Due to her allergic reaction to methimazole, it was quite likely that she might have a similar reaction to propylthiouracil (PTU). I discussed the advantages and disadvantages with her of PTU therapy, I-131 therapy, and thyroidectomy. I recommended I-131 therapy and she concurred. On 01/26/06 she was treated with 12.55 mCi of I-131. Unfortunately, that dose did not destroy enough thyrocytes so she required a second dose of 26.3 mCi of I-131 on 10/12/06.  By 0/18/08 she was hypothyroid, with a TSH of 19.9, free T4 of 0.46, and free T3 of 1.2. I started her on a daily dose of 88 mcg of Synthroid at that time.   2. Clinical course: During the last 16 years we have worked with [name] about several issues. For a detailed discussion of these issues see my encounter note from 12/21/17:  A. [name] thyroid hormone levels have fluctuated based upon flare ups of her Graves disease activity, flare ups of her Hashimoto's disease activity, her compliance with taking Synthroid, and her absorption of the Synthroid. As a  result we have adjusted her Synthroid doses many time.  B. Her Graves' disease exophthalmos has improved over time, but persists. When her allergies act up she has more conjunctival erythema and irritation than a person without exophthalmos would have.   C. She has had problems several times with secondary hyperparathyroidism due to vitamin D deficiency and hypocalcemia due to inadequate calcium intake.   D. She had gradually gained weight over time, but her weight has fluctuated with her Graves' disease activity.  E. She has her covid vaccinations.  F. On 07/12/20 she was admitted to the [facility] for acute abdominal pain in the setting of a large right ovarian cyst. She was noted on CT scan to have a large fibroid uterus and a heterogeneous hypodense right adnexal mass. Her hCG was positive for pregnancy. Her course was c/w an early missed spontaneous abortion or a ruptured ovarian cyst.   G. On 10/06/20 she had an IUD inserted. She then had intermittent bleeding for 4 months.  H. Prior to her visit on 07/01/21 she stopped the lisinopril because of coughing.  3. The patient's last [facility] clinic visit was on 02/16/22. At that visit I continued her Synthroid dose of 100 mcg/day for 6 days per week and 1/2 tablet on the seventh days. I asked her to take one Citracal tablets of 500-600 mg strength for four days per week, but she is not taking it as frequently due to constipation. I also continued her Biotech once weekly.    A. In the interim she has been healthy. She has lost 8 ponds in the past 7 months. She is slightly more tremulous. She has more insomnia and early awakening. She has felt warmer. She is now living alone. She is more tired, so is taking in quite a lot of caffeine again. She is a bit more irritable and anxious.   B.  The IUD is still in place. She has intermittent spotting.   C. Her allergies are "terrible'"this year. She does not take any allergy medications.   D. She is busy, but has not been  exercising much. She has not changed her eating.   E. Her weight has decreased 8 more pounds since December 2022.  F. She is taking her Synthroid in the same way at 4:50 AM. She takes her calcium around dinner time when she takes it. She takes her once weekly vitamin D at dinner.   4. Review of Systems: She has been healthy.  Constitutional: She feels "okay-a bit hyper". Energy level is not higher and is probably lower. She is more tired despite drinking more caffeinated beverages. She is more jittery. She is not having headaches now.    Eyes: Her left eye is a bit more prominent. She wears her glasses or contacts all the time now. She has no complaints of ocular pressure with "up and out" gaze in either eye. There are no significant eye complaints.  Neck: She has no complaints of anterior neck swelling, soreness, tenderness,  pressure, discomfort, or difficulty swallowing.  Heart: Her HR has usually been normal. Heart rate increases with exercise or other physical activity. She has no complaints of palpitations, irregular heat beats, chest pain, or chest pressure. Gastrointestinal: She has some belly hunger in the mornings. Her carb intake  has remained fairly stable.  The patient has no complaints of stomach aches or pains, diarrhea, or constipation. Hands: She has more tremor.  Legs: Muscle mass and strength seem normal. She no longer has posterior calf pains.   There are no complaints of numbness, tingling, burning, or pain. No edema is noted. Feet: There are no obvious foot problems. There are no complaints of numbness, tingling, burning, or pain. No edema is noted. GYN: LMP was on September 2022. Her GYN told her that it is common for her not to have menses with her Mirena IUD.    PAST MEDICAL, FAMILY, AND SOCIAL HISTORY:  Past Medical History:  Diagnosis Date   Fatigue    Hypothyroid    Hypothyroidism following radioiodine therapy    Menometrorrhagia    Secondary oligomenorrhea     Thyroiditis, autoimmune    Thyrotoxicosis with diffuse goiter    Toxic diffuse goiter with exophthalmos     Family History  Problem Relation Age of Onset   Heart disease Father    Cancer Father        small bowel cancer    Thyroid disease Maternal Aunt        hypothyroid   Heart disease Maternal Aunt    Heart disease Paternal Aunt    Thyroid disease Paternal Grandfather        hyperthyroid   Heart disease Paternal Grandfather    Diabetes Neg Hx      Current Outpatient Medications:    levonorgestrel (MIRENA) 20 MCG/DAY IUD, Mirena 20 mcg/24 hours (7 yrs) 52 mg intrauterine device  Take 1 device by intrauterine route., Disp: , Rfl:    SYNTHROID 100 MCG tablet, TAKE 1 TABLET 6 DAYS A WEEK. NONE ON THE 7TH DAY, Disp: 30 tablet, Rfl: 5   Vitamin D, Ergocalciferol, (DRISDOL) 50000 units CAPS capsule, Take 50,000 Units by mouth every Sunday., Disp: , Rfl:    ergocalciferol (VITAMIN D2) 1.25 MG (50000 UT) capsule, Vitamin D2 1,250 mcg (50,000 unit) capsule  Take 1 capsule every week by oral route. (Patient not taking: Reported on 02/16/2022), Disp: , Rfl:    lisinopril (ZESTRIL) 2.5 MG tablet, TAKE 1 TABLET BY MOUTH EVERY DAY (Patient not taking: Reported on 07/01/2021), Disp: 30 tablet, Rfl: 11  Allergies as of 05/26/2022 - Review Complete 05/26/2022  Allergen Reaction Noted   Lisinopril Cough 07/01/2021   Tapazole [methimazole] Hives 04/20/2011    1. 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 She loves her job. She moved back to [place]. She now lives alone. She now attends [employer] in [place] for her BSN/RN degree.  2. Activities: She walks a lot at work.    3. Smoking, alcohol, or drugs: None 4. Primary Care Provider: Dr. Briscoe Deutscher at [facility], fax [phone] 5. OB-GYN: [facility] on [address]: There are no other significant problems involving  Dajanee's other body systems.   Objective:  Vital Signs:  BP 118/74   Pulse 90   Wt 160 lb 9.6 oz (72.8 kg)   BMI 29.61 kg/m     Ht Readings from Last 3 Encounters:  07/13/20 5' 1.75" (1.568 m)  11/05/18 5' 2.01" (1.575 m)  04/26/18 5' 2.6" (1.59 m)   Wt Readings from Last 3 Encounters:  05/26/22 160 lb 9.6 oz (72.8 kg)  02/16/22 167 lb (75.8 kg)  10/07/21 168 lb 3.2  oz (76.3 kg)   HC Readings from Last 3 Encounters:  No data found for [facility]   Body surface area is 1.78 meters squared.  Facility age limit for growth %iles is 20 years. Facility age limit for growth %iles is 20 years.   Constitutional: The patient looks healthy and appears physically and emotionally well, but overweight/obese. She is alert and bright. Her affect and insight are normal. She looks good. She has lost 8 pounds in 7 months. She does not appear overtly physically or emotionally hyperthyroid or hypothyroid..  Eyes: There is no arcus or proptosis. Her eyes are not prominent per se, but the left eye is a bit more prominent than the right. There is no lid lag or decreased range of motion.  Mouth: The oropharynx appears normal. The tongue appears normal. There is normal oral moisture. There is no obvious gingivitis. There is no tongue tremor.  Neck: There are no bruits present. The thyroid gland appears normal in size. The thyroid gland is a bit larger today at 21 grams in size, with the right lobe mildly enlarged and the left lobe larger. The consistency of the thyroid gland is normal. There is no thyroid tenderness to palpation. Lungs: The lungs are clear. Air movement is good. Heart: The heart rhythm and rate appear normal. Heart sounds S1 and S2 are normal. I do not appreciate any pathologic heart murmurs. Abdomen: The abdomen is mildly enlarged. Bowel sounds are normal. The abdomen is larger, soft, and non-tender. There is no obviously palpable hepatomegaly, splenomegaly, or other masses.  Arms: Muscle mass  appears appropriate for age.  Hands: There is a trace of tremor. Phalangeal and metacarpophalangeal joints appear normal. Palms are normal. There is no palmar erythema.  Legs: Muscle mass appears appropriate for age. There is no edema.  Neurologic: Muscle strength is normal for age and gender  in both the upper and the lower extremities. Muscle tone appears normal. Sensation to touch is normal in the legs.    LAB DATA:  Labs 05/23/22: TSH 0.18, free T4 1.4, free T3 3.2, TSI pending; PTH 55 (ref 16-77), calcium 9.7, 25-OH vitamin D 79  Labs 12/30/21: TSH 1.18, free T4 1.4, free T3 2.9; CMP normal, except glucose 61; PTH 59 (ref 16-77), calcium 9.5, 25-OH vitamin D 75  Labs 09/05/21: TSH 1.71, free T4 1.1, free T3 2.6; TSI <89  Labs 06/22/21: TSH 5.96,  free T4 1.5, free T3 2.4; CMP normal; PTH 53 (ref 16-77), calcium 9.3, 25-OH vitamin D 79  Labs 03/21/21: TSH 0.20, free T4 1.6, free T3 2.8; CMP normal; PTH 73 (ref 16-77), calcium 9.2 (ref 8.6-10.2), 25-OH vitamin D 73;   Labs 09/10/20: TSH 0.89, free T4 1.3, free T3  2.7; 25-OH vitamin D 53  Labs 07/12/20: CMP normal; CBC normal, except WBC 11,000; hCG 443.5 (ref <5)  Labs 06/23/20: TSH 1.15, free T4 1.3, free T3 2.8, TSI <89; PTH 53, calcium 9.6, 25-OH vitamin D 40  Labs 05/14/20: TSH 5.86, free T4 1.1, free T3 2.2; PTH 40 (ref 14-64), calcium 9.5 (ref 8.6-10.2), 25-OH vitamin D 51  Labs 02/02/20: TSH 3.73, free T4 1.1, free T3 2.3, TSI <89  Labs 12/05/19: TSH 0.21, free T4 1.3, free T3 2.9; PTH 63, calcium 9.5  Labs 11/01/18: TSH 1.09, free T4 1.3, free T3 2.6; PTH 56, calcium 9.8, 25-OH vitamin D 47  Labs 04/14/18: TSH 1.06, free T4 1.2, free T3 2.6; PTH 29, calcium 9.6, 25-OH vitamin D 71  Labs  12/15/17: TSH 1.75, free T4 1.4, free T3 2.7; CMP normal; 25-OH vitamin D 70, calcium 8.9, PTH 66 (ref 14-64)  Labs 07/06/17: TSH 1.35, free T4 1.4, free T3 3.2; iron 101 (ref 40-190), CBC normal   Labs 03/30/17: TSI 114 (ref <140), TSH 0.82, free  T4 1.4, free T3 2.6; calcium 9.7, PTH 47, 25-OH vitamin D 57  Labs 01/02/17: TSH 4.83, free T4 1.0, free T3 2.2, TSI 147 (ref <140)  Labs 10/23/16: PTH 83 (ref 14-64), calcium 9.3, 25-OH vitamin D 10 (ref 30-100)  Labs 10/11/16: TSH 0.74, free T4 1.2, free T3 2.7, TSI 147  Labs 07/07/16: TSH 1.08, free T4 1.2, free T3 2.9, TSI < 89  Labs 04/13/16: TSH 3.54, free T4 1.30, free T3 2.4  Labs 12/17/15: TSH 2.35, free T4 1.40, free T3 2.7, TSI 94  Labs 10/04/15: TSH 4.290, free T4 1.15, free T3 2.4, TSI 62 (ref <140)  Labs 03/30/15:TSH 0.382, free T4 1.29, free T3 3.1  Labs 08/19/14: TSH 1.027, free T4 1.07, free T3 2.6  Labs 05/20/14: TSH 0.736, free T4 1.34, free T3 3.1,   Labs 10/20/13; TSH 2.414, free T4 1.2, free T3 2.8  Labs 08/05/13: TSH 8.221, free T4 1.18, free T3 2.5. She may have missed the half-dose last Sunday.  Labs 04/25/13: TSH 0.225, free T4 1.30, free T3 2.8  Labs 02/17/13: TSH 3.069, free T4 1.27, free T3 2.5  Labs 08/06/12: TSH 3.072, free T4 1.22, free T3 2.7  Labs 12/12/11: TSH 2.456, free T4 1.49, Free T3 2.7, FSH 2.9, LH 3.5, TSI 82 (<140%),     Assessment and Plan:   ASSESSMENT:  1-3. Hypothyroid, s/p I-131 therapy for Graves' disease on 01/26/06 and 10/12/06/ Graves' Dz/Hashimoto's thyroiditis    A. Patient's TFTs and TSI levels have continued to fluctuate over time. We have had to adjust her Synthroid doses many timed in the past 16 years to compensate for the fluctuations in her TSI and TFT levels.   B. In January 2021 she was hyperthyroid. Her hyperthyroidism with tremor was likely due to a recurrence of Graves' disease. In such a circumstance, we reduced her Synthroid hormone dose to keep her TSH in the goal range of 1.0-2.0.   C. In March 2021 she was euthyroid according to the lab's reference range, but her TSH was above 3.4, which is the value accepted by many thyroidologists for the true physiologic upper limit of normal above. In July 2021 her TSH was  higher and frankly elevated. We increased her Synthroid dosage at that time. Her TFTs in November 2021 were quite good. She was clinically euthyroid.  D. In May 2022 she was mildly hyperthyroid clinically and chemically. Her free T4 was much higher and her free T3 was a bit higher. Consequently her TSH was much lower. We reduced her Synthroid to one tablet per day for 6 days each week.   E. The two most likely causes of her hyperthyroidism were;   1). She may have been  having more endogenous Graves' disease activity.   2). The IUD may have resulted in her having a lower estrogen level, so her TBG may be lower and her free thyroid hormone levels may have been higher as a result.  F. In August, 2022, she was hypothyroid according to her TSH. She needed more thyroid hormone. In October 2022 and again in April 2023 her TFTs were mid-euthyroid. She was clinically euthyroid at her visit in April 2023.   G. Unfortunately,  she has developed some thyrotoxic symptoms recently. Her TSH is low and her free T3 is high for her age. She is having yet another partial re-activation of her Graves' disease.  H. Her Graves Dz is still fighting with her 61 Dz for dominance, with the background of being hypothyroid due to her radioactive iodine therapy. On most days the Hashimoto's disease is her major factor, but recently the Graves' disease has been more active. We are doing our best to keep her Synthroid doses appropriate to her needs. 4. Goiter:  A. Her thyroid gland had shrunk back to normal size in December 2019 and was still normal in size in November 2021. Her thyroid gland in May 2022 was mildly enlarged and was softer, favoring Graves' disease. Her goiter in August 2022 was still enlarged, but the right lobe had shrunk back to normal size. Her right lobe and her overall thyroid gland were larger in December 2022, but smaller in April 2023. In July 2023 both lobes are larger, especially the left lobe.  B. The  waxing and waning of thyroid gland size is c/w evolving Hashimoto's disease and intermittent Graves' disease. Her thyroiditis appeared to be clinically quiescent.  5. Exophthalmos: Her left eye is still somewhat more prominent than the right eye. She feels that the left eye is a bit more prominent than it was 6 months ago. I can't see a real difference from her visit in December 2022. There are no eye proptosis, lid lag, or restriction of extra-ocular muscle movements in either eye today. Her exophthalmos parallels her TSI activity.  She does not need Tepezza at this time., but may need it in the future. 6. Oligomenorrhea and menometrorrhagia: Resolved 7. Overweight/unintentional weight loss: She had lost a great deal more weight, probably due to hyperthyroidism. She has lost 8 pounds since her last visit.   8-9. Vitamin D deficiency/secondary hyperparathyroidism/relative hypocalcemia:   A. Her December 2017 labs showed that she was hyperparathyroid secondary to vitamin D deficiency disease. After starting Biotech, however, her lab tests in May 2018 showed that her vitamin D level had normalized and her PTH level had normalized.   B. Her February 2019 labs show that her vitamin D was in the middle of the normal range, but her PTH was slightly elevated and her calcium was still low normal, despite the effect of the increased PTH to cause resorption of calcium from her bone stores. These labs showed that she was not taking in enough calcium, so she had secondary hyperparathyroidism, secondary then to inadequate calcium intake. She needed at least one good calcium pill per day. The Citracal form of calcium citrate was preferred. I asked her to continue the One-A-Day for Women.   C. Her labs in June 2019 showed that despite taking the Citracal only about twice a week, her calcium had increased and her PTH had decreased as expected physiologically. In December 2019, however, her vitamin D decreased and her PTH  increased, all c/w missing too many doses of vitamin D and calcium.   D. Her labs in January 2021 showed a higher PTH and lower calcium after completely stopping her calcium intake due to constipation. I asked her to resume taking calcium and add Miralax or Metamucil as needed. In August 2021 her PTH, calcium, and vitamin D levels were normal.   E. In May 2022 and in August 2022, her PTH was good, her calcium was low-normal, and her vitamin D level was good. In February 2023, however, her  PTH was higher , her calcium was unchanged, and her vitamin D level was good. She needed to take the calcium 4 times per week, when she puts her cell phone in its charger. She had not been taking enough calcium and needed more calcium.  F. In July 2023 her calcium was a bit higher and her PTH was a bit lower. Her vitamin D level is good. She is taking some calcium, but not enough. She has been reluctant to take the extra calcium due to prior problems with constipation. 10. Dyspepsia/reflux: These problems had significantly improved after taking ranitidine, but then worsened when she stopped the ranitidine, increased her carb intake, and decreased her activity level. She does not think she has a problem today.      11. Fatigue, other. She is more tired, despite taking in more caffeine. 12. Hypertension:  A. Her BP had increased in December 2019. Her hypertension is a bit better today. She also had  family history of hypertension in both parents. At her May 2021 visit I felt that it was time to exercise consistently or to begin antihypertensive treatment. I started her on lisinopril. B. Because her BP is normal at other times, and because lisinopril caused coughing, she had decided to discontinue lisinopril treatment.  C.Her BP is normal today. She needs to exercise more.  13. Sleeping difficulties: She is still having some early awakening presumably due to her higher thyroid hormone levels and her higher caffeine intake.      PLAN:  1. Diagnostic: Repeat TFTs and TSI in mid-September 2023.    2. Therapeutic: Hold all thyroid hormone for the next 5 days. Then continue the Synthroid dose of 100 mcg/day for 5 days each week, but add 1/2 tablet on Wednesdays and Sundays. Continue Biotech weekly. Continue One-A-Day for Women daily. Increase Citracal 500-600 mg tablets to one tablet per day on 4 days per week. Eat Right and exercise daily. 3. Patient education: We discussed issues of Graves' Dz, Hashimoto's Dz, exophthalmos, and the need to adjust her Synthroid doses accordingly. We discussed vitamin D intake, calcium intake, and secondary hyperparathyroidism. We also discussed hypertension.  4. Follow-up: 3-5 months with a new provider  Level of Service: This visit lasted in excess of 60 minutes. More than 50% of the visit was devoted to counseling.  Tillman Sers, MD, CDE [facility] 05/26/2022 9:20 AM

## 2022-05-26 ENCOUNTER — Ambulatory Visit (INDEPENDENT_AMBULATORY_CARE_PROVIDER_SITE_OTHER): Payer: BC Managed Care – PPO | Admitting: "Endocrinology

## 2022-05-26 ENCOUNTER — Encounter (INDEPENDENT_AMBULATORY_CARE_PROVIDER_SITE_OTHER): Payer: Self-pay | Admitting: "Endocrinology

## 2022-05-26 VITALS — BP 118/74 | HR 90 | Wt 160.6 lb

## 2022-05-26 DIAGNOSIS — E211 Secondary hyperparathyroidism, not elsewhere classified: Secondary | ICD-10-CM

## 2022-05-26 DIAGNOSIS — H0520 Unspecified exophthalmos: Secondary | ICD-10-CM

## 2022-05-26 DIAGNOSIS — E559 Vitamin D deficiency, unspecified: Secondary | ICD-10-CM

## 2022-05-26 DIAGNOSIS — E0500 Thyrotoxicosis with diffuse goiter without thyrotoxic crisis or storm: Secondary | ICD-10-CM | POA: Diagnosis not present

## 2022-05-26 DIAGNOSIS — G479 Sleep disorder, unspecified: Secondary | ICD-10-CM | POA: Diagnosis not present

## 2022-05-26 DIAGNOSIS — R1013 Epigastric pain: Secondary | ICD-10-CM

## 2022-05-26 DIAGNOSIS — E349 Endocrine disorder, unspecified: Secondary | ICD-10-CM

## 2022-05-26 DIAGNOSIS — I10 Essential (primary) hypertension: Secondary | ICD-10-CM

## 2022-05-26 DIAGNOSIS — E063 Autoimmune thyroiditis: Secondary | ICD-10-CM

## 2022-05-26 DIAGNOSIS — E663 Overweight: Secondary | ICD-10-CM

## 2022-05-26 DIAGNOSIS — R251 Tremor, unspecified: Secondary | ICD-10-CM

## 2022-05-26 LAB — PTH, INTACT AND CALCIUM
Calcium: 9.7 mg/dL (ref 8.6–10.2)
PTH: 55 pg/mL (ref 16–77)

## 2022-05-26 LAB — T4, FREE: Free T4: 1.4 ng/dL (ref 0.8–1.8)

## 2022-05-26 LAB — VITAMIN D 25 HYDROXY (VIT D DEFICIENCY, FRACTURES): Vit D, 25-Hydroxy: 79 ng/mL (ref 30–100)

## 2022-05-26 LAB — THYROID STIMULATING IMMUNOGLOBULIN: TSI: <89 % baseline (ref <140)

## 2022-05-26 LAB — TSH: TSH: 0.18 mIU/L — ABNORMAL LOW

## 2022-05-26 LAB — T3, FREE: T3, Free: 3.2 pg/mL (ref 2.3–4.2)

## 2022-05-26 NOTE — Patient Instructions (Signed)
Follow up visit in 3-5 months with a new provider. Please hold thyroid hormone for the next 5 days. Then reduce the Synthroid dose to 100 mcg/day on 5 days per week and 50 mcg/day (1/2 tablet) on two days each week, such as Wednesday and Sunday. Please repeat lab tests in mid-September.   At [facility], we are committed to providing exceptional care. You will receive a patient satisfaction survey through text or email regarding your visit today. Your opinion is important to me. Comments are appreciated.

## 2022-06-08 ENCOUNTER — Encounter (INDEPENDENT_AMBULATORY_CARE_PROVIDER_SITE_OTHER): Payer: Self-pay

## 2022-06-09 ENCOUNTER — Encounter (INDEPENDENT_AMBULATORY_CARE_PROVIDER_SITE_OTHER): Payer: Self-pay | Admitting: "Endocrinology

## 2022-07-13 ENCOUNTER — Other Ambulatory Visit (INDEPENDENT_AMBULATORY_CARE_PROVIDER_SITE_OTHER): Payer: Self-pay | Admitting: "Endocrinology

## 2022-07-13 DIAGNOSIS — E890 Postprocedural hypothyroidism: Secondary | ICD-10-CM

## 2022-09-12 LAB — THYROID STIMULATING IMMUNOGLOBULIN: TSI: <89 % baseline (ref <140)

## 2022-09-12 LAB — T3, FREE: T3, Free: 2.5 pg/mL (ref 2.3–4.2)

## 2022-09-12 LAB — TSH: TSH: 2.52 mIU/L

## 2022-09-12 LAB — T4, FREE: Free T4: 1.1 ng/dL (ref 0.8–1.8)

## 2022-12-14 LAB — COLOGUARD: COLOGUARD: NEGATIVE

## 2024-07-22 ENCOUNTER — Encounter: Payer: Self-pay | Admitting: Nurse Practitioner

## 2024-07-22 ENCOUNTER — Ambulatory Visit: Admitting: Nurse Practitioner

## 2024-07-22 VITALS — BP 122/84 | HR 94 | Temp 97.2°F

## 2024-07-22 DIAGNOSIS — Z789 Other specified health status: Secondary | ICD-10-CM

## 2024-07-22 NOTE — Progress Notes (Signed)
 Occupational Health- [facility]  Subjective:  Patient ID: Jessica Maxwell, female    DOB: Mar 12, 1976  Age: 48 y.o. MRN: [id number]  CC: wellness exam   HPI Jessica Maxwell presents for wellness exam visit for insurance benefit.  Patient has a ERE:[facility] and Dr. Faythe endocrinology.  PMH significant for: Graves disease  Last labs per PCP were completed: May 2025   Health Maintenance:  Colonoscopy:cologuard 2 years ago.  Mammogram: yearly Pap: up to date.     Smoker: never  Immunizations:  Shingrix-  due next year.  Flu- yearly Tdap: up to date.     Lifestyle: Diet- no particular diet.  Exercise- walks a lot at work.     Past Medical History:  Diagnosis Date   Fatigue    Hypothyroid    Hypothyroidism following radioiodine therapy    Menometrorrhagia    Secondary oligomenorrhea    Thyroiditis, autoimmune    Thyrotoxicosis with diffuse goiter    Toxic diffuse goiter with exophthalmos     Past Surgical History:  Procedure Laterality Date   ECTOPIC PREGNANCY SURGERY      Outpatient Medications Prior to Visit  Medication Sig Dispense Refill   levonorgestrel  (MIRENA ) 20 MCG/DAY IUD Mirena  20 mcg/24 hours (7 yrs) 52 mg intrauterine device  Take 1 device by intrauterine route.     levothyroxine  (SYNTHROID ) 75 MCG tablet Take 75 mcg by mouth daily before breakfast.     Vitamin D , Ergocalciferol , (DRISDOL) 50000 units CAPS capsule Take 50,000 Units by mouth every Sunday.     SYNTHROID  100 MCG tablet TAKE 1 TABLET 6 DAYS A WEEK, NONE ON THE 7TH DAY FOR HYPOTHYROIDISM 30 tablet 5   ergocalciferol  (VITAMIN D2) 1.25 MG (50000 UT) capsule Vitamin D2 1,250 mcg (50,000 unit) capsule  Take 1 capsule every week by oral route. (Patient not taking: Reported on 02/16/2022)     lisinopril  (ZESTRIL ) 2.5 MG tablet TAKE 1 TABLET BY MOUTH EVERY DAY (Patient not taking: Reported on 07/22/2024) 30 tablet 11   No facility-administered medications prior to visit.     ROS Review of Systems  Constitutional:  Negative for activity change.  HENT:  Negative for hearing loss.   Eyes:  Negative for visual disturbance.  Respiratory:  Negative for shortness of breath.   Cardiovascular:  Negative for chest pain.  Gastrointestinal:  Negative for constipation, diarrhea, nausea and vomiting.  Musculoskeletal: Negative.   Neurological:  Negative for dizziness and headaches.  Psychiatric/Behavioral:  Negative for sleep disturbance.     Objective:  BP 122/84 (BP Location: Left Arm, Patient Position: Sitting, Cuff Size: Normal)   Pulse 94   Temp (!) 97.2 F (36.2 C)   SpO2 100%   Physical Exam Constitutional:      General: She is not in acute distress. HENT:     Head: Normocephalic.     Right Ear: Tympanic membrane, ear canal and external ear normal.     Left Ear: Tympanic membrane, ear canal and external ear normal.     Mouth/Throat:     Pharynx: No oropharyngeal exudate or posterior oropharyngeal erythema.  Eyes:     Pupils: Pupils are equal, round, and reactive to light.  Cardiovascular:     Rate and Rhythm: Normal rate and regular rhythm.     Heart sounds: Normal heart sounds.  Abdominal:     General: Bowel sounds are normal.     Palpations: Abdomen is soft.  Musculoskeletal:     Right lower leg: No  edema.     Left lower leg: No edema.  Neurological:     General: No focal deficit present.     Mental Status: She is alert and oriented to person, place, and time.  Psychiatric:        Mood and Affect: Mood normal.        Behavior: Behavior normal.      Assessment & Plan:    Jessica Maxwell was seen today for wellness exam.  Diagnoses and all orders for this visit:  Participant in health and wellness plan Adult wellness physical was conducted today. Importance of diet and exercise were discussed in detail.  We reviewed immunizations and gave recommendations regarding current immunization needed for age.  Preventative health exams are up to  date.   Patient was advised yearly wellness exam    No orders of the defined types were placed in this encounter.   No orders of the defined types were placed in this encounter.   Follow-up: as needed.
